# Patient Record
Sex: Female | Born: 1960 | Race: White | Hispanic: No | Marital: Married | Smoking: Never smoker
Health system: Southern US, Community
[De-identification: ages and names within clinical notes are randomized; demographics above are authoritative.]

## PROBLEM LIST (undated history)

## (undated) DIAGNOSIS — M810 Age-related osteoporosis without current pathological fracture: Secondary | ICD-10-CM

## (undated) HISTORY — DX: Age-related osteoporosis without current pathological fracture: M81.0

## (undated) HISTORY — PX: TONSILECTOMY/ADENOIDECTOMY WITH MYRINGOTOMY: SHX6125

---

## 1998-10-11 ENCOUNTER — Ambulatory Visit (HOSPITAL_BASED_OUTPATIENT_CLINIC_OR_DEPARTMENT_OTHER): Admission: RE | Admit: 1998-10-11 | Discharge: 1998-10-11 | Payer: Self-pay | Admitting: Orthopedic Surgery

## 1999-01-21 ENCOUNTER — Encounter (INDEPENDENT_AMBULATORY_CARE_PROVIDER_SITE_OTHER): Payer: Self-pay

## 1999-01-21 ENCOUNTER — Other Ambulatory Visit: Admission: RE | Admit: 1999-01-21 | Discharge: 1999-01-21 | Payer: Self-pay | Admitting: Obstetrics and Gynecology

## 1999-03-14 ENCOUNTER — Encounter (INDEPENDENT_AMBULATORY_CARE_PROVIDER_SITE_OTHER): Payer: Self-pay | Admitting: Specialist

## 1999-03-14 ENCOUNTER — Other Ambulatory Visit: Admission: RE | Admit: 1999-03-14 | Discharge: 1999-03-14 | Payer: Self-pay | Admitting: Obstetrics and Gynecology

## 2000-08-31 ENCOUNTER — Encounter: Payer: Self-pay | Admitting: Family Medicine

## 2000-08-31 ENCOUNTER — Ambulatory Visit (HOSPITAL_COMMUNITY): Admission: RE | Admit: 2000-08-31 | Discharge: 2000-08-31 | Payer: Self-pay | Admitting: Family Medicine

## 2000-09-10 ENCOUNTER — Other Ambulatory Visit: Admission: RE | Admit: 2000-09-10 | Discharge: 2000-09-10 | Payer: Self-pay | Admitting: Obstetrics and Gynecology

## 2001-12-16 ENCOUNTER — Other Ambulatory Visit: Admission: RE | Admit: 2001-12-16 | Discharge: 2001-12-16 | Payer: Self-pay | Admitting: Obstetrics and Gynecology

## 2002-12-31 ENCOUNTER — Other Ambulatory Visit: Admission: RE | Admit: 2002-12-31 | Discharge: 2002-12-31 | Payer: Self-pay | Admitting: Obstetrics and Gynecology

## 2004-02-09 ENCOUNTER — Other Ambulatory Visit: Admission: RE | Admit: 2004-02-09 | Discharge: 2004-02-09 | Payer: Self-pay | Admitting: Obstetrics and Gynecology

## 2005-06-05 ENCOUNTER — Other Ambulatory Visit: Admission: RE | Admit: 2005-06-05 | Discharge: 2005-06-05 | Payer: Self-pay | Admitting: Obstetrics and Gynecology

## 2006-08-10 ENCOUNTER — Other Ambulatory Visit: Admission: RE | Admit: 2006-08-10 | Discharge: 2006-08-10 | Payer: Self-pay | Admitting: Obstetrics and Gynecology

## 2009-01-26 ENCOUNTER — Other Ambulatory Visit: Admission: RE | Admit: 2009-01-26 | Discharge: 2009-01-26 | Payer: Self-pay | Admitting: Obstetrics and Gynecology

## 2009-02-25 ENCOUNTER — Encounter: Admission: RE | Admit: 2009-02-25 | Discharge: 2009-02-25 | Payer: Self-pay | Admitting: Family Medicine

## 2010-01-02 IMAGING — CR DG LUMBAR SPINE COMPLETE 4+V
5 series · 5 of 5 positions shown · non-contrast
Comparison: None

CLINICAL DATA: Osteoporosis, evaluate for fracture

LUMBAR SPINE - COMPLETE 4+ VIEW

[view not recorded (1 of 5)]
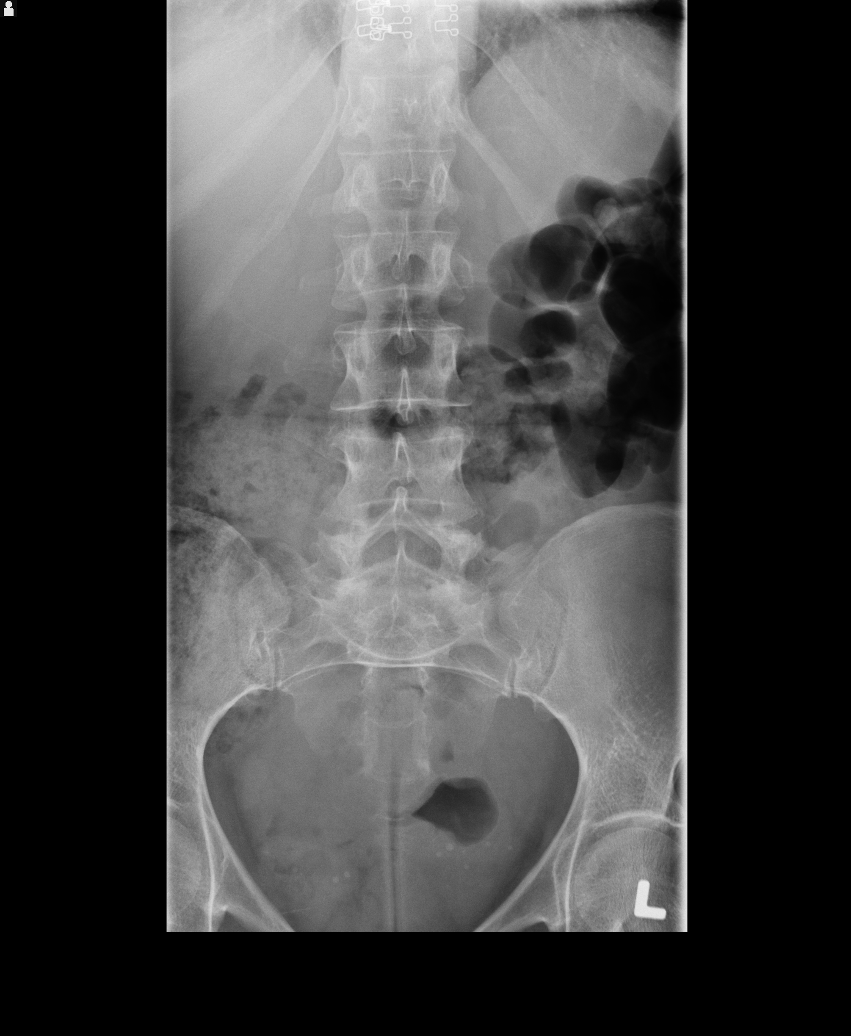

[view not recorded (2 of 5)]
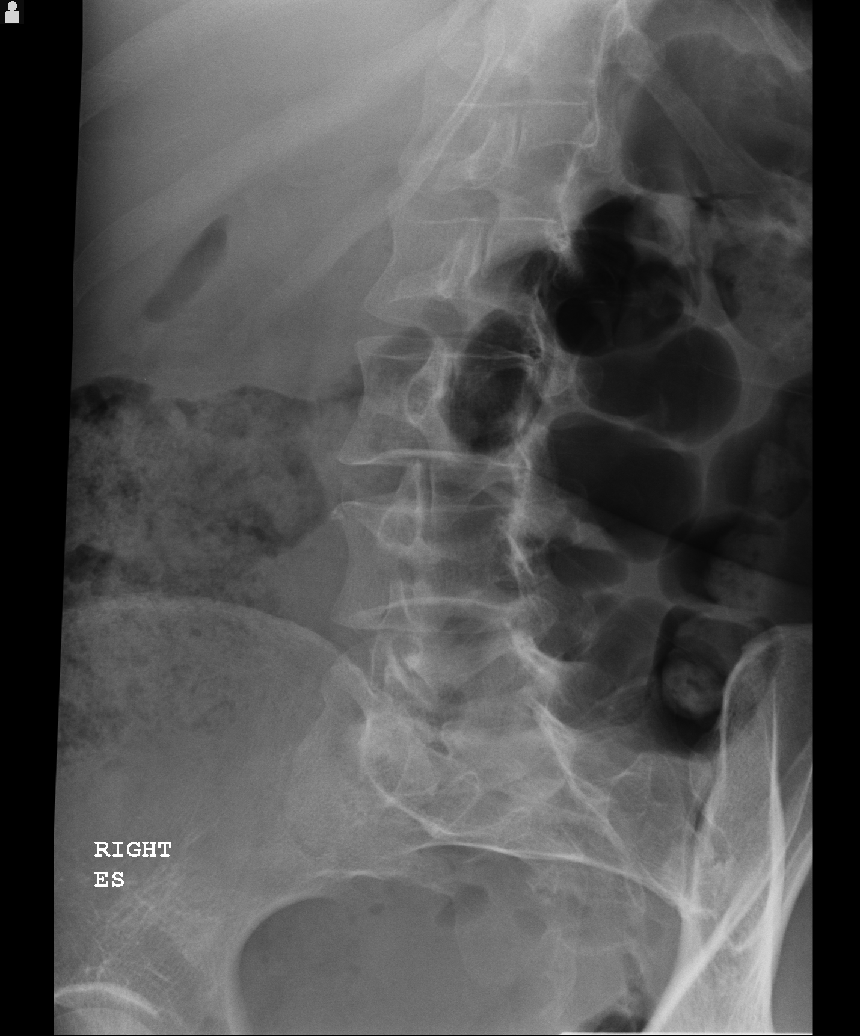

[view not recorded (3 of 5)]
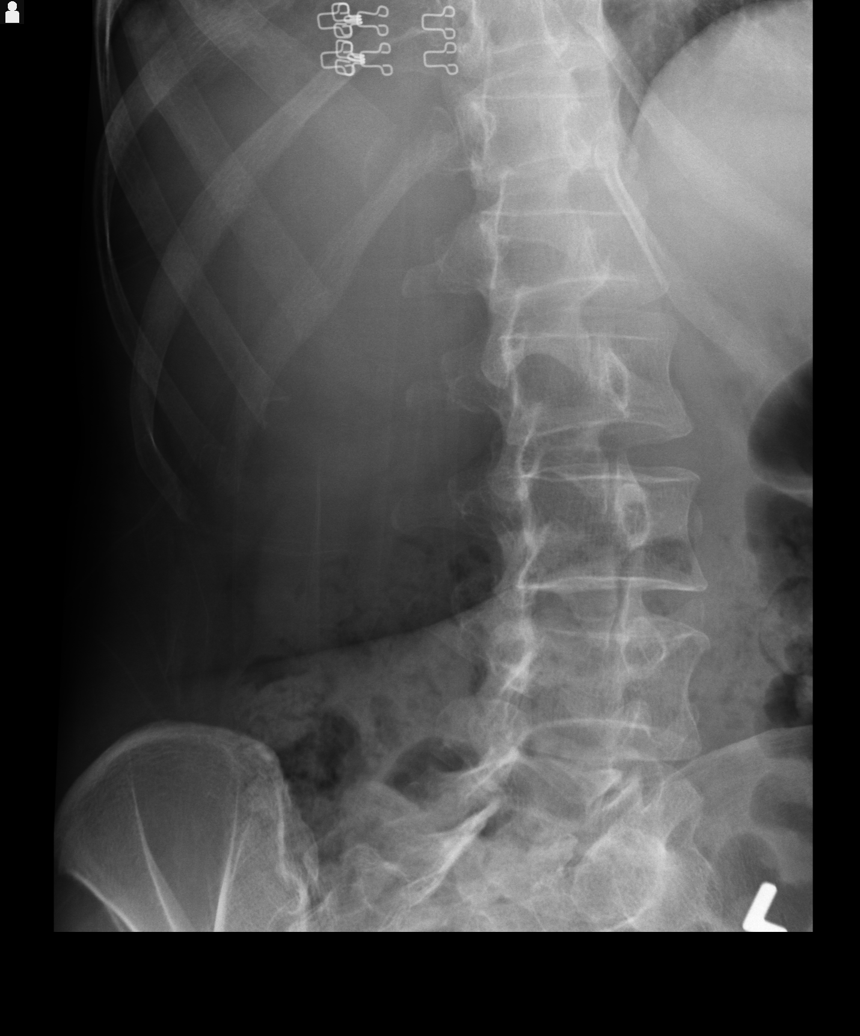

[view not recorded (4 of 5)]
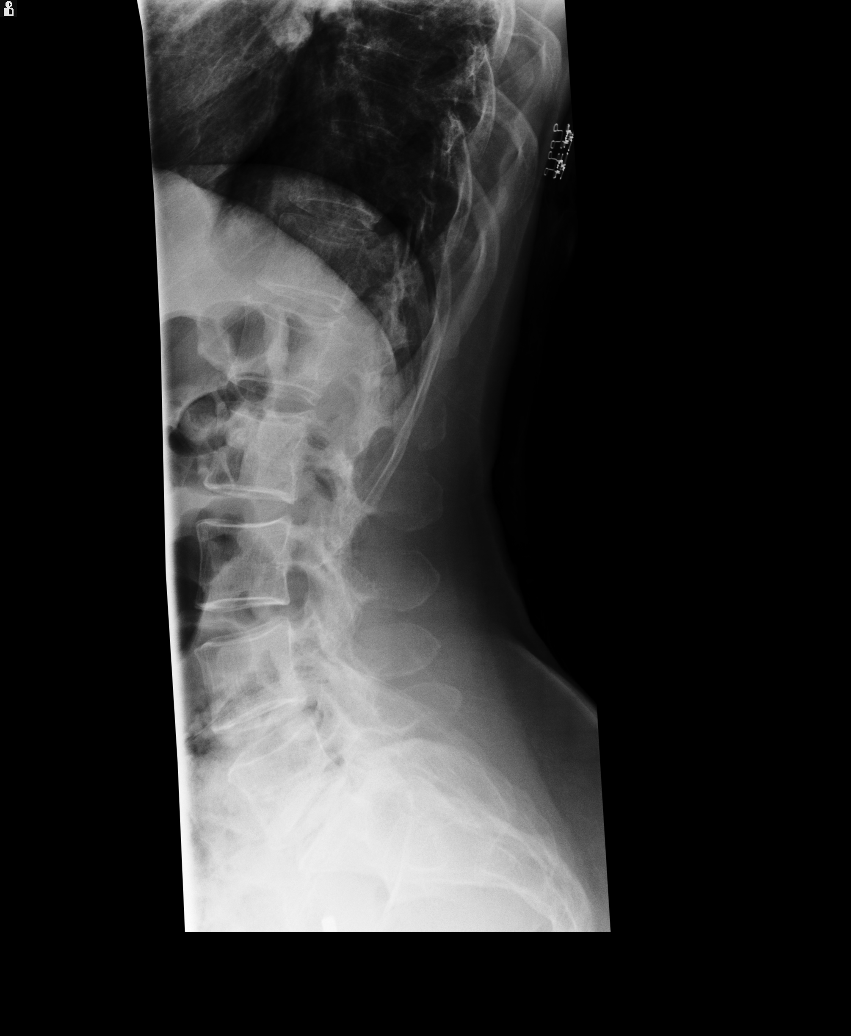

[view not recorded (5 of 5)]
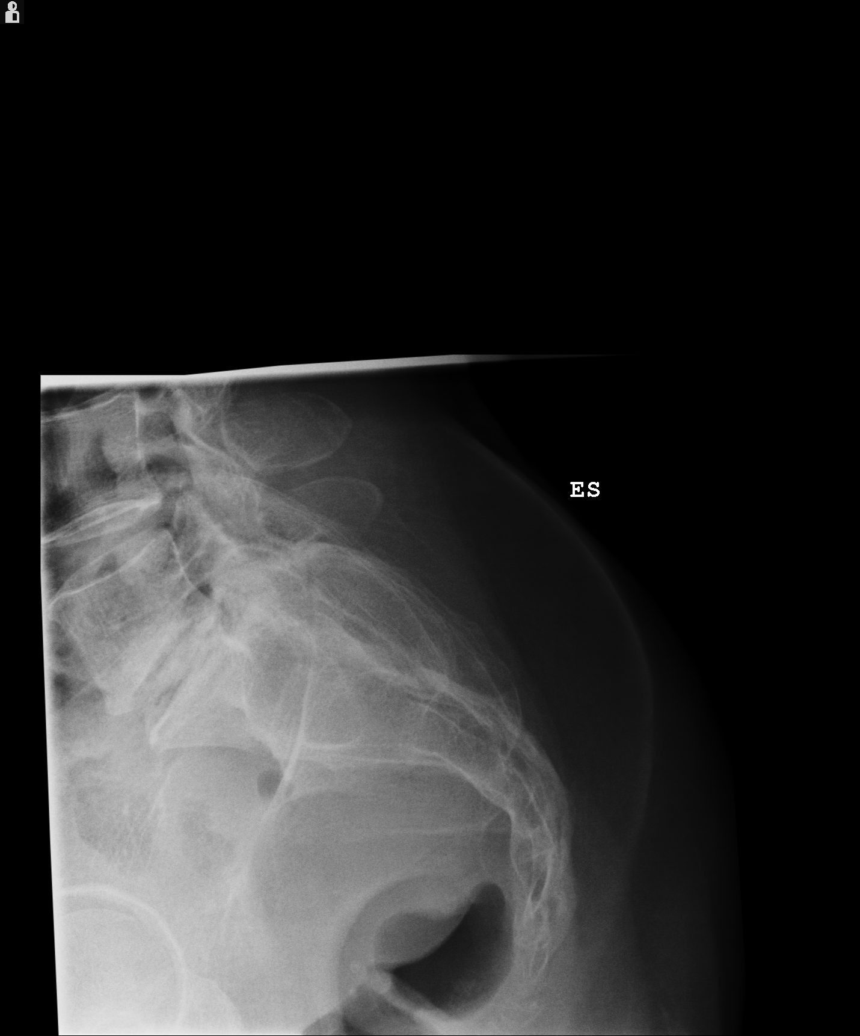

[5 of 5 positions shown; findings below may reference images not displayed]

FINDINGS: The lumbar vertebrae are in normal alignment.  There is
degenerative disc disease at the L5 S1 level with slight loss of
disc space.  No acute compression deformity is seen.  The SI joints
appear normal.
IMPRESSION: Normal alignment.  Degenerative disc disease at L5-S1.

## 2016-09-12 DIAGNOSIS — Z01419 Encounter for gynecological examination (general) (routine) without abnormal findings: Secondary | ICD-10-CM | POA: Diagnosis not present

## 2016-09-12 DIAGNOSIS — Z6822 Body mass index (BMI) 22.0-22.9, adult: Secondary | ICD-10-CM | POA: Diagnosis not present

## 2017-02-19 DIAGNOSIS — Z1231 Encounter for screening mammogram for malignant neoplasm of breast: Secondary | ICD-10-CM | POA: Diagnosis not present

## 2017-02-21 DIAGNOSIS — Z0000 Encounter for general adult medical examination without abnormal findings: Secondary | ICD-10-CM | POA: Diagnosis not present

## 2017-02-21 DIAGNOSIS — Z136 Encounter for screening for cardiovascular disorders: Secondary | ICD-10-CM | POA: Diagnosis not present

## 2017-03-06 DIAGNOSIS — N943 Premenstrual tension syndrome: Secondary | ICD-10-CM | POA: Diagnosis not present

## 2017-03-06 DIAGNOSIS — M810 Age-related osteoporosis without current pathological fracture: Secondary | ICD-10-CM | POA: Diagnosis not present

## 2017-03-06 DIAGNOSIS — Z23 Encounter for immunization: Secondary | ICD-10-CM | POA: Diagnosis not present

## 2017-03-06 DIAGNOSIS — Z0000 Encounter for general adult medical examination without abnormal findings: Secondary | ICD-10-CM | POA: Diagnosis not present

## 2017-07-10 DIAGNOSIS — L821 Other seborrheic keratosis: Secondary | ICD-10-CM | POA: Diagnosis not present

## 2017-07-10 DIAGNOSIS — L0100 Impetigo, unspecified: Secondary | ICD-10-CM | POA: Diagnosis not present

## 2017-07-10 DIAGNOSIS — D485 Neoplasm of uncertain behavior of skin: Secondary | ICD-10-CM | POA: Diagnosis not present

## 2017-07-10 DIAGNOSIS — D225 Melanocytic nevi of trunk: Secondary | ICD-10-CM | POA: Diagnosis not present

## 2017-07-10 DIAGNOSIS — D2271 Melanocytic nevi of right lower limb, including hip: Secondary | ICD-10-CM | POA: Diagnosis not present

## 2017-07-27 DIAGNOSIS — R6889 Other general symptoms and signs: Secondary | ICD-10-CM | POA: Diagnosis not present

## 2018-01-21 DIAGNOSIS — M25561 Pain in right knee: Secondary | ICD-10-CM | POA: Diagnosis not present

## 2018-01-23 DIAGNOSIS — Z01419 Encounter for gynecological examination (general) (routine) without abnormal findings: Secondary | ICD-10-CM | POA: Diagnosis not present

## 2018-01-23 DIAGNOSIS — Z6821 Body mass index (BMI) 21.0-21.9, adult: Secondary | ICD-10-CM | POA: Diagnosis not present

## 2018-01-24 ENCOUNTER — Encounter: Payer: Self-pay | Admitting: Obstetrics and Gynecology

## 2018-01-24 DIAGNOSIS — M810 Age-related osteoporosis without current pathological fracture: Secondary | ICD-10-CM | POA: Diagnosis not present

## 2018-01-24 DIAGNOSIS — M85852 Other specified disorders of bone density and structure, left thigh: Secondary | ICD-10-CM | POA: Diagnosis not present

## 2018-02-12 DIAGNOSIS — K080 Exfoliation of teeth due to systemic causes: Secondary | ICD-10-CM | POA: Diagnosis not present

## 2018-02-20 DIAGNOSIS — Z1231 Encounter for screening mammogram for malignant neoplasm of breast: Secondary | ICD-10-CM | POA: Diagnosis not present

## 2018-04-17 DIAGNOSIS — Z0000 Encounter for general adult medical examination without abnormal findings: Secondary | ICD-10-CM | POA: Diagnosis not present

## 2018-04-17 DIAGNOSIS — Z13220 Encounter for screening for lipoid disorders: Secondary | ICD-10-CM | POA: Diagnosis not present

## 2018-04-23 DIAGNOSIS — Z0000 Encounter for general adult medical examination without abnormal findings: Secondary | ICD-10-CM | POA: Diagnosis not present

## 2018-04-29 DIAGNOSIS — Z23 Encounter for immunization: Secondary | ICD-10-CM | POA: Diagnosis not present

## 2018-05-29 DIAGNOSIS — H5213 Myopia, bilateral: Secondary | ICD-10-CM | POA: Diagnosis not present

## 2018-07-24 DIAGNOSIS — R509 Fever, unspecified: Secondary | ICD-10-CM | POA: Diagnosis not present

## 2019-02-28 ENCOUNTER — Encounter: Payer: Self-pay | Admitting: Obstetrics and Gynecology

## 2019-02-28 DIAGNOSIS — Z803 Family history of malignant neoplasm of breast: Secondary | ICD-10-CM | POA: Diagnosis not present

## 2019-02-28 DIAGNOSIS — Z1231 Encounter for screening mammogram for malignant neoplasm of breast: Secondary | ICD-10-CM | POA: Diagnosis not present

## 2019-04-09 DIAGNOSIS — Z23 Encounter for immunization: Secondary | ICD-10-CM | POA: Diagnosis not present

## 2019-04-16 DIAGNOSIS — E7800 Pure hypercholesterolemia, unspecified: Secondary | ICD-10-CM | POA: Diagnosis not present

## 2019-04-16 DIAGNOSIS — Z0000 Encounter for general adult medical examination without abnormal findings: Secondary | ICD-10-CM | POA: Diagnosis not present

## 2019-04-28 DIAGNOSIS — Z0000 Encounter for general adult medical examination without abnormal findings: Secondary | ICD-10-CM | POA: Diagnosis not present

## 2019-06-16 DIAGNOSIS — Z85828 Personal history of other malignant neoplasm of skin: Secondary | ICD-10-CM | POA: Diagnosis not present

## 2019-06-16 DIAGNOSIS — Z86018 Personal history of other benign neoplasm: Secondary | ICD-10-CM | POA: Diagnosis not present

## 2019-06-16 DIAGNOSIS — D2271 Melanocytic nevi of right lower limb, including hip: Secondary | ICD-10-CM | POA: Diagnosis not present

## 2019-06-16 DIAGNOSIS — D225 Melanocytic nevi of trunk: Secondary | ICD-10-CM | POA: Diagnosis not present

## 2019-06-25 ENCOUNTER — Other Ambulatory Visit: Payer: Self-pay

## 2019-06-25 ENCOUNTER — Other Ambulatory Visit (HOSPITAL_COMMUNITY)
Admission: RE | Admit: 2019-06-25 | Discharge: 2019-06-25 | Disposition: A | Discharge status: Home / Self Care | Payer: Federal, State, Local not specified - PPO | Source: Ambulatory Visit | Attending: Obstetrics and Gynecology | Admitting: Obstetrics and Gynecology

## 2019-06-25 ENCOUNTER — Encounter: Payer: Self-pay | Admitting: Obstetrics and Gynecology

## 2019-06-25 ENCOUNTER — Other Ambulatory Visit: Payer: Self-pay | Admitting: *Deleted

## 2019-06-25 ENCOUNTER — Ambulatory Visit: Payer: Federal, State, Local not specified - PPO | Admitting: Obstetrics and Gynecology

## 2019-06-25 VITALS — BP 100/62 | HR 84 | Temp 98.0°F | Ht 63.5 in | Wt 129.0 lb

## 2019-06-25 DIAGNOSIS — Z124 Encounter for screening for malignant neoplasm of cervix: Secondary | ICD-10-CM | POA: Diagnosis not present

## 2019-06-25 DIAGNOSIS — N952 Postmenopausal atrophic vaginitis: Secondary | ICD-10-CM | POA: Diagnosis not present

## 2019-06-25 DIAGNOSIS — Z8739 Personal history of other diseases of the musculoskeletal system and connective tissue: Secondary | ICD-10-CM

## 2019-06-25 DIAGNOSIS — Z01419 Encounter for gynecological examination (general) (routine) without abnormal findings: Secondary | ICD-10-CM

## 2019-06-25 MED ORDER — NONFORMULARY OR COMPOUNDED ITEM: 3 refills | Status: DC

## 2019-06-25 NOTE — Patient Instructions (Addendum)
EXERCISE AND DIET:  We recommended that you start or continue a regular exercise program for good health. Regular exercise means any activity that makes your heart beat faster and makes you sweat.  We recommend exercising at least 30 minutes per day at least 3 days a week, preferably 4 or 5.  We also recommend a diet low in fat and sugar.  Inactivity, poor dietary choices and obesity can cause diabetes, heart attack, stroke, and kidney damage, among others.   ° °ALCOHOL AND SMOKING:  Women should limit their alcohol intake to no more than 7 drinks/beers/glasses of wine (combined, not each!) per week. Moderation of alcohol intake to this level decreases your risk of breast cancer and liver damage. And of course, no recreational drugs are part of a healthy lifestyle.  And absolutely no smoking or even second hand smoke. Most people know smoking can cause heart and lung diseases, but did you know it also contributes to weakening of your bones? Aging of your skin?  Yellowing of your teeth and nails? ° °CALCIUM AND VITAMIN D:  Adequate intake of calcium and Vitamin D are recommended.  The recommendations for exact amounts of these supplements seem to change often, but generally speaking 1,200 mg of calcium (between diet and supplement) and 800 units of Vitamin D per day seems prudent. Certain women may benefit from higher intake of Vitamin D.  If you are among these women, your doctor will have told you during your visit.   ° °PAP SMEARS:  Pap smears, to check for cervical cancer or precancers,  have traditionally been done yearly, although recent scientific advances have shown that most women can have pap smears less often.  However, every woman still should have a physical exam from her gynecologist every year. It will include a breast check, inspection of the vulva and vagina to check for abnormal growths or skin changes, a visual exam of the cervix, and then an exam to evaluate the size and shape of the uterus and  ovaries.  And after 58 years of age, a rectal exam is indicated to check for rectal cancers. We will also provide age appropriate advice regarding health maintenance, like when you should have certain vaccines, screening for sexually transmitted diseases, bone density testing, colonoscopy, mammograms, etc.  ° °MAMMOGRAMS:  All women over 40 years old should have a yearly mammogram. Many facilities now offer a "3D" mammogram, which may cost around $50 extra out of pocket. If possible,  we recommend you accept the option to have the 3D mammogram performed.  It both reduces the number of women who will be called back for extra views which then turn out to be normal, and it is better than the routine mammogram at detecting truly abnormal areas.   ° °COLON CANCER SCREENING: Now recommend starting at age 45. At this time colonoscopy is not covered for routine screening until 50. There are take home tests that can be done between 45-49.  ° °COLONOSCOPY:  Colonoscopy to screen for colon cancer is recommended for all women at age 50.  We know, you hate the idea of the prep.  We agree, BUT, having colon cancer and not knowing it is worse!!  Colon cancer so often starts as a polyp that can be seen and removed at colonscopy, which can quite literally save your life!  And if your first colonoscopy is normal and you have no family history of colon cancer, most women don't have to have it again for   10 years.  Once every ten years, you can do something that may end up saving your life, right?  We will be happy to help you get it scheduled when you are ready.  Be sure to check your insurance coverage so you understand how much it will cost.  It may be covered as a preventative service at no cost, but you should check your particular policy.   ° ° ° °Breast Self-Awareness °Breast self-awareness means being familiar with how your breasts look and feel. It involves checking your breasts regularly and reporting any changes to your  health care provider. °Practicing breast self-awareness is important. A change in your breasts can be a sign of a serious medical problem. Being familiar with how your breasts look and feel allows you to find any problems early, when treatment is more likely to be successful. All women should practice breast self-awareness, including women who have had breast implants. °How to do a breast self-exam °One way to learn what is normal for your breasts and whether your breasts are changing is to do a breast self-exam. To do a breast self-exam: °Look for Changes ° °1. Remove all the clothing above your waist. °2. Stand in front of a mirror in a room with good lighting. °3. Put your hands on your hips. °4. Push your hands firmly downward. °5. Compare your breasts in the mirror. Look for differences between them (asymmetry), such as: °? Differences in shape. °? Differences in size. °? Puckers, dips, and bumps in one breast and not the other. °6. Look at each breast for changes in your skin, such as: °? Redness. °? Scaly areas. °7. Look for changes in your nipples, such as: °? Discharge. °? Bleeding. °? Dimpling. °? Redness. °? A change in position. °Feel for Changes °Carefully feel your breasts for lumps and changes. It is best to do this while lying on your back on the floor and again while sitting or standing in the shower or tub with soapy water on your skin. Feel each breast in the following way: °· Place the arm on the side of the breast you are examining above your head. °· Feel your breast with the other hand. °· Start in the nipple area and make ¾ inch (2 cm) overlapping circles to feel your breast. Use the pads of your three middle fingers to do this. Apply light pressure, then medium pressure, then firm pressure. The light pressure will allow you to feel the tissue closest to the skin. The medium pressure will allow you to feel the tissue that is a little deeper. The firm pressure will allow you to feel the tissue  close to the ribs. °· Continue the overlapping circles, moving downward over the breast until you feel your ribs below your breast. °· Move one finger-width toward the center of the body. Continue to use the ¾ inch (2 cm) overlapping circles to feel your breast as you move slowly up toward your collarbone. °· Continue the up and down exam using all three pressures until you reach your armpit. ° °Write Down What You Find ° °Write down what is normal for each breast and any changes that you find. Keep a written record with breast changes or normal findings for each breast. By writing this information down, you do not need to depend only on memory for size, tenderness, or location. Write down where you are in your menstrual cycle, if you are still menstruating. °If you are having trouble noticing differences   in your breasts, do not get discouraged. With time you will become more familiar with the variations in your breasts and more comfortable with the exam. °How often should I examine my breasts? °Examine your breasts every month. If you are breastfeeding, the best time to examine your breasts is after a feeding or after using a breast pump. If you menstruate, the best time to examine your breasts is 5-7 days after your period is over. During your period, your breasts are lumpier, and it may be more difficult to notice changes. °When should I see my health care provider? °See your health care provider if you notice: °· A change in shape or size of your breasts or nipples. °· A change in the skin of your breast or nipples, such as a reddened or scaly area. °· Unusual discharge from your nipples. °· A lump or thick area that was not there before. °· Pain in your breasts. °· Anything that concerns you. ° ° °Osteoporosis ° °Osteoporosis is thinning and loss of density in your bones. Osteoporosis makes bones more brittle and fragile and more likely to break (fracture). Over time, osteoporosis can cause your bones to become  so weak that they fracture after a minor fall. Bones in the hip, wrist, and spine are most likely to fracture due to osteoporosis. °What are the causes? °The exact cause of this condition is not known. °What increases the risk? °You may be at greater risk for osteoporosis if you: °· Have a family history of the condition. °· Have poor nutrition. °· Use steroid medicines, such as prednisone. °· Are female. °· Are age 50 or older. °· Smoke or have a history of smoking. °· Are not physically active (are sedentary). °· Are white (Caucasian) or of Asian descent. °· Have a small body frame. °· Take certain medicines, such as antiseizure medicines. °What are the signs or symptoms? °A fracture might be the first sign of osteoporosis, especially if the fracture results from a fall or injury that usually would not cause a bone to break. Other signs and symptoms include: °· Pain in the neck or low back. °· Stooped posture. °· Loss of height. °How is this diagnosed? °This condition may be diagnosed based on: °· Your medical history. °· A physical exam. °· A bone mineral density test, also called a DXA or DEXA test (dual-energy X-ray absorptiometry test). This test uses X-rays to measure the amount of minerals in your bones. °How is this treated? °The goal of treatment is to strengthen your bones and lower your risk for a fracture. Treatment may involve: °· Making lifestyle changes, such as: °? Including foods with more calcium and vitamin D in your diet. °? Doing weight-bearing and muscle-strengthening exercises. °? Stopping tobacco use. °? Limiting alcohol intake. °· Taking medicine to slow the process of bone loss or to increase bone density. °· Taking daily supplements of calcium and vitamin D. °· Taking hormone replacement medicines, such as estrogen for women and testosterone for men. °· Monitoring your levels of calcium and vitamin D. °Follow these instructions at home: ° °Activity °· Exercise as told by your health care  provider. Ask your health care provider what exercises and activities are safe for you. You should do: °? Exercises that make you work against gravity (weight-bearing exercises), such as tai chi, yoga, or walking. °? Exercises to strengthen muscles, such as lifting weights. °Lifestyle °· Limit alcohol intake to no more than 1 drink a day for nonpregnant women and   2 drinks a day for men. One drink equals 12 oz of beer, 5 oz of wine, or 1½ oz of hard liquor. °· Do not use any products that contain nicotine or tobacco, such as cigarettes and e-cigarettes. If you need help quitting, ask your health care provider. °Preventing falls °· Use devices to help you move around (mobility aids) as needed, such as canes, walkers, scooters, or crutches. °· Keep rooms well-lit and clutter-free. °· Remove tripping hazards from walkways, including cords and throw rugs. °· Install grab bars in bathrooms and safety rails on stairs. °· Use rubber mats in the bathroom and other areas that are often wet or slippery. °· Wear closed-toe shoes that fit well and support your feet. Wear shoes that have rubber soles or low heels. °· Review your medicines with your health care provider. Some medicines can cause dizziness or changes in blood pressure, which can increase your risk of falling. °General instructions °· Include calcium and vitamin D in your diet. Calcium is important for bone health, and vitamin D helps your body to absorb calcium. Good sources of calcium and vitamin D include: °? Certain fatty fish, such as salmon and tuna. °? Products that have calcium and vitamin D added to them (fortified products), such as fortified cereals. °? Egg yolks. °? Cheese. °? Liver. °· Take over-the-counter and prescription medicines only as told by your health care provider. °· Keep all follow-up visits as told by your health care provider. This is important. °Contact a health care provider if: °· You have never been screened for osteoporosis and you  are: °? A woman who is age 65 or older. °? A man who is age 70 or older. °Get help right away if: °· You fall or injure yourself. °Summary °· Osteoporosis is thinning and loss of density in your bones. This makes bones more brittle and fragile and more likely to break (fracture),even with minor falls. °· The goal of treatment is to strengthen your bones and reduce your risk for a fracture. °· Include calcium and vitamin D in your diet. Calcium is important for bone health, and vitamin D helps your body to absorb calcium. °· Talk with your health care provider about screening for osteoporosis if you are a woman who is age 65 or older, or a man who is age 70 or older. °This information is not intended to replace advice given to you by your health care provider. Make sure you discuss any questions you have with your health care provider. °Document Released: 03/22/2005 Document Revised: 05/25/2017 Document Reviewed: 04/06/2017 °Elsevier Patient Education © 2020 [employer] ° °

## 2019-06-25 NOTE — Addendum Note (Signed)
Addended by: Dorothy Spark on: 06/25/2019 03:25 PM   Modules accepted: Orders

## 2019-06-25 NOTE — Progress Notes (Signed)
58 y.o. G26P2002 Married White or Caucasian Not Hispanic or Latino female here for annual exam.  She has no vasomotor symptoms. She is asking about bio-identical hormones. Less elasticity of the skin, more weight in her abdomen.  She feels a hair less sharp than normal. Vaginal dryness, helped with estradiol. No vaginal bleeding. No dyspareunia.   She is on Boniva for osteoporosis. Building bone. She has been on the [facility] for about 3 years.   No LMP recorded. Patient is postmenopausal.          Sexually active: Yes.    The current method of family planning is post menopausal status.    Exercising: Yes.    cardio 5 days a week, yoga, weight bearing exercises. Smoker:  no  Health Maintenance: Pap:  2019 normal Dr. Clementeen Hoof  History of abnormal Pap:  no MMG:  04/2019 at [facility]  BMD:   2019 Osteoporotic in lower spine. She states it has been like that in her 76's.  Colonoscopy: 2015 normal due in Jan  TDaP:  Unsure, she will check with her primary  Gardasil:no    reports that she has never smoked. She has never used smokeless tobacco. She reports current alcohol use of about 2.0 standard drinks of alcohol per week. She reports that she does not use drugs. 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 2 sons, 30 and 73. One is back home working during [employer].    Past Medical History:  Diagnosis Date  . Osteoporosis     Past Surgical History:  Procedure Laterality Date  . TONSILECTOMY/ADENOIDECTOMY WITH MYRINGOTOMY Bilateral     Current Outpatient Medications  Medication Sig Dispense Refill  . calcium-vitamin D (OSCAL WITH D) 500-200 MG-UNIT tablet Take 1 tablet by mouth.    . estradiol (ESTRACE) 0.1 MG/GM vaginal cream Place 1 Applicatorful vaginally 3 (three) times a week.    . ibandronate (BONIVA) 150 MG tablet Take 150 mg by mouth every 30 (thirty) days. Take in the morning with a full glass of water, on an empty stomach, and do not take anything  else by mouth or lie down for the next 30 min.    . sertraline (ZOLOFT) 25 MG tablet Take 25 mg by mouth daily.     No current facility-administered medications for this visit.    Family History  Problem Relation Age of Onset  . Leukemia Mother        CLL turned into lymphoma.   . Lung cancer Father        died at 36  . Breast cancer Paternal Grandmother   . Breast cancer Other 30       paternal 1/2 Aunt    Review of Systems  All other systems reviewed and are negative.   Exam:   BP 100/62   Pulse 84   Temp 98 F (36.7 C)   Ht 5' 3.5" (1.613 m)   Wt 129 lb (58.5 kg)   SpO2 97%   BMI 22.49 kg/m   Weight change: @WEIGHTCHANGE @ Height:   Height: 5' 3.5" (161.3 cm)  Ht Readings from Last 3 Encounters:  06/25/19 5' 3.5" (1.613 m)    General appearance: alert, cooperative and appears stated age Head: Normocephalic, without obvious abnormality, atraumatic Neck: no adenopathy, supple, symmetrical, trachea midline and thyroid normal to inspection and palpation Lungs: clear to auscultation bilaterally Cardiovascular: regular rate and rhythm Breasts: normal appearance, no masses or tenderness Abdomen: soft, non-tender; non  distended,  no masses,  no organomegaly Extremities: extremities normal, atraumatic, no cyanosis or edema Skin: Skin color, texture, turgor normal. No rashes or lesions Lymph nodes: Cervical, supraclavicular, and axillary nodes normal. No abnormal inguinal nodes palpated Neurologic: Grossly normal   Pelvic: External genitalia:  no lesions              Urethra:  normal appearing urethra with no masses, tenderness or lesions              Bartholins and Skenes: normal                 Vagina: mildly atrophic appearing vagina with normal color and discharge, no lesions              Cervix: no lesions and friable with pap, no ectropion               Bimanual Exam:  Uterus:  normal size, contour, position, consistency, mobility, non-tender              Adnexa:  no mass, fullness, tenderness               Rectovaginal: Confirms               Anus:  normal sphincter tone, no lesions  Gae Dry chaperoned for the exam.  A:  Well Woman with normal exam  H/O osteoporosis, on Boniva  Vaginal atrophy well treated with compounded vaginal estrogen  P:   Pap with hpv  Labs with primary  Mammogram and DEXA just done  Discussed breast self exam  Discussed calcium and vit D intake  She uses compounded estrace cream, will call in the equivalent of estrace cream, 1 gram 2 x week at hs.

## 2019-07-01 LAB — CYTOLOGY - PAP
Comment: NEGATIVE
Diagnosis: NEGATIVE
High risk HPV: NEGATIVE

## 2019-07-09 DIAGNOSIS — M25562 Pain in left knee: Secondary | ICD-10-CM | POA: Diagnosis not present

## 2019-07-09 DIAGNOSIS — M79671 Pain in right foot: Secondary | ICD-10-CM | POA: Diagnosis not present

## 2019-07-18 DIAGNOSIS — G5761 Lesion of plantar nerve, right lower limb: Secondary | ICD-10-CM | POA: Diagnosis not present

## 2019-09-03 DIAGNOSIS — R432 Parageusia: Secondary | ICD-10-CM | POA: Diagnosis not present

## 2019-09-03 DIAGNOSIS — Z03818 Encounter for observation for suspected exposure to other biological agents ruled out: Secondary | ICD-10-CM | POA: Diagnosis not present

## 2019-09-03 DIAGNOSIS — J029 Acute pharyngitis, unspecified: Secondary | ICD-10-CM | POA: Diagnosis not present

## 2019-09-30 DIAGNOSIS — Z1159 Encounter for screening for other viral diseases: Secondary | ICD-10-CM | POA: Diagnosis not present

## 2019-10-03 DIAGNOSIS — Z86010 Personal history of colonic polyps: Secondary | ICD-10-CM | POA: Diagnosis not present

## 2020-03-03 ENCOUNTER — Encounter: Payer: Self-pay | Admitting: Obstetrics and Gynecology

## 2020-03-03 DIAGNOSIS — Z1231 Encounter for screening mammogram for malignant neoplasm of breast: Secondary | ICD-10-CM | POA: Diagnosis not present

## 2020-03-31 DIAGNOSIS — Z23 Encounter for immunization: Secondary | ICD-10-CM | POA: Diagnosis not present

## 2020-04-27 DIAGNOSIS — Z13220 Encounter for screening for lipoid disorders: Secondary | ICD-10-CM | POA: Diagnosis not present

## 2020-04-27 DIAGNOSIS — Z0000 Encounter for general adult medical examination without abnormal findings: Secondary | ICD-10-CM | POA: Diagnosis not present

## 2020-05-03 DIAGNOSIS — M810 Age-related osteoporosis without current pathological fracture: Secondary | ICD-10-CM | POA: Diagnosis not present

## 2020-05-03 DIAGNOSIS — Z23 Encounter for immunization: Secondary | ICD-10-CM | POA: Diagnosis not present

## 2020-05-03 DIAGNOSIS — Z0000 Encounter for general adult medical examination without abnormal findings: Secondary | ICD-10-CM | POA: Diagnosis not present

## 2020-05-03 DIAGNOSIS — N943 Premenstrual tension syndrome: Secondary | ICD-10-CM | POA: Diagnosis not present

## 2020-06-15 DIAGNOSIS — Z85828 Personal history of other malignant neoplasm of skin: Secondary | ICD-10-CM | POA: Diagnosis not present

## 2020-06-15 DIAGNOSIS — D225 Melanocytic nevi of trunk: Secondary | ICD-10-CM | POA: Diagnosis not present

## 2020-06-15 DIAGNOSIS — D2271 Melanocytic nevi of right lower limb, including hip: Secondary | ICD-10-CM | POA: Diagnosis not present

## 2020-06-15 DIAGNOSIS — Z86018 Personal history of other benign neoplasm: Secondary | ICD-10-CM | POA: Diagnosis not present

## 2020-06-17 DIAGNOSIS — Z03818 Encounter for observation for suspected exposure to other biological agents ruled out: Secondary | ICD-10-CM | POA: Diagnosis not present

## 2020-06-17 DIAGNOSIS — J069 Acute upper respiratory infection, unspecified: Secondary | ICD-10-CM | POA: Diagnosis not present

## 2020-06-29 NOTE — Progress Notes (Signed)
60 y.o. G58P2002 Married White or Caucasian Not Hispanic or Latino female here for annual exam.  No vaginal bleeding. No dyspareunia. Uses vaginal estrogen.   She c/o feeling some vaginal prolapse intermittently, notices it one x a week when she is standing. Not painful. Weird sensation.     No LMP recorded. Patient is postmenopausal.          Sexually active: Yes.    The current method of family planning is post menopausal status.    Exercising: Yes.    5 times a week cardio Smoker:  no  Health Maintenance: Pap:  06-25-2019 negative, HR HPV negative  History of abnormal Pap:  no MMG: Fall 2021 with [facility]  BMD:   2019 Osteoporotic in lower spine. She states it has been like that in her 40's. On Boniva.  Colonoscopy: 2021 with [facility] normal per patient TDaP:  UTD per patient Gardasil: n/a   reports that she has never smoked. She has never used smokeless tobacco. She reports current alcohol use of about 2.0 standard drinks of alcohol per week. She reports that she does not use drugs. 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 2 grown sons  Past Medical History:  Diagnosis Date  . Osteoporosis     Past Surgical History:  Procedure Laterality Date  . TONSILECTOMY/ADENOIDECTOMY WITH MYRINGOTOMY Bilateral     Current Outpatient Medications  Medication Sig Dispense Refill  . calcium-vitamin D (OSCAL WITH D) 500-200 MG-UNIT tablet Take 1 tablet by mouth.    . ibandronate (BONIVA) 150 MG tablet Take 150 mg by mouth every 30 (thirty) days. Take in the morning with a full glass of water, on an empty stomach, and do not take anything else by mouth or lie down for the next 30 min.    . NONFORMULARY OR COMPOUNDED ITEM Compounded vaginal estrogen cream 0.02%. Place 1/2 gram vaginally at hs twice weekly.  Dispense 12 grams. 3 refills. 1 each 3  . sertraline (ZOLOFT) 25 MG tablet Take 25 mg by mouth daily.    . fluorouracil (EFUDEX) 5 % cream Apply 1  application topically 2 (two) times daily. (Patient not taking: Reported on 07/01/2020)     No current facility-administered medications for this visit.    Family History  Problem Relation Age of Onset  . Leukemia Mother        CLL turned into lymphoma.   . Lung cancer Father        died at 81  . Breast cancer Paternal Grandmother   . Breast cancer Other 30       paternal 1/2 Aunt    Review of Systems  Constitutional: Negative.   HENT: Negative.   Eyes: Negative.   Respiratory: Negative.   Cardiovascular: Negative.   Gastrointestinal: Negative.   Endocrine: Negative.   Genitourinary: Negative.   Musculoskeletal: Negative.   Skin: Negative.   Allergic/Immunologic: Negative.   Neurological: Negative.   Hematological: Negative.   Psychiatric/Behavioral: Negative.     Exam:   BP 110/60 (BP Location: Right Arm, Patient Position: Sitting, Cuff Size: Normal)   Pulse 64   Resp 16   Ht 5' 3.5" (1.613 m)   Wt 124 lb (56.2 kg)   BMI 21.62 kg/m   Weight change: @WEIGHTCHANGE @ Height:   Height: 5' 3.5" (161.3 cm)  Ht Readings from Last 3 Encounters:  07/01/20 5' 3.5" (1.613 m)  06/25/19 5' 3.5" (1.613 m)    General appearance: alert,  cooperative and appears stated age Head: Normocephalic, without obvious abnormality, atraumatic Neck: no adenopathy, supple, symmetrical, trachea midline and thyroid normal to inspection and palpation Lungs: clear to auscultation bilaterally Cardiovascular: regular rate and rhythm Breasts: normal appearance, no masses or tenderness Abdomen: soft, non-tender; non distended,  no masses,  no organomegaly Extremities: extremities normal, atraumatic, no cyanosis or edema Skin: Skin color, texture, turgor normal. No rashes or lesions Lymph nodes: Cervical, supraclavicular, and axillary nodes normal. No abnormal inguinal nodes palpated Neurologic: Grossly normal   Pelvic: External genitalia:  no lesions              Urethra:  normal appearing  urethra with no masses, tenderness or lesions              Bartholins and Skenes: normal                 Vagina: mildly atrophic appearing vagina with normal color and discharge, no lesions. Small grade 2 cystocele with valsalva, no other significant prolapse.              Cervix: no lesions               Bimanual Exam:  Uterus:  normal size, contour, position, consistency, mobility, non-tender and retroverted              Adnexa: no mass, fullness, tenderness               Rectovaginal: Confirms               Anus:  normal sphincter tone, no lesions   1. Well woman exam No pap this year Colonoscopy UTD Mammogram UTD Labs with primary Discussed breast self awareness  2. History of osteoporosis She is over due for a DEXA, does this with her primary. She will contact her primary Discussed calcium and vit D intake Continue to exercise On Boniva (with her primary)  3. Vaginal atrophy Continue compounded vaginal estrogen  4. Midline cystocele Minimal symptoms. Discussed prolapse. Information given. Avoid heavy lifting and straining Discussed kegels to strengthen her pelvic floor

## 2020-07-01 ENCOUNTER — Other Ambulatory Visit: Payer: Self-pay

## 2020-07-01 ENCOUNTER — Encounter: Payer: Self-pay | Admitting: Obstetrics and Gynecology

## 2020-07-01 ENCOUNTER — Telehealth: Payer: Self-pay | Admitting: *Deleted

## 2020-07-01 ENCOUNTER — Ambulatory Visit (INDEPENDENT_AMBULATORY_CARE_PROVIDER_SITE_OTHER): Payer: Federal, State, Local not specified - PPO | Admitting: Obstetrics and Gynecology

## 2020-07-01 VITALS — BP 110/60 | HR 64 | Resp 16 | Ht 63.5 in | Wt 124.0 lb

## 2020-07-01 DIAGNOSIS — Z01419 Encounter for gynecological examination (general) (routine) without abnormal findings: Secondary | ICD-10-CM

## 2020-07-01 DIAGNOSIS — N8111 Cystocele, midline: Secondary | ICD-10-CM

## 2020-07-01 DIAGNOSIS — N952 Postmenopausal atrophic vaginitis: Secondary | ICD-10-CM

## 2020-07-01 DIAGNOSIS — Z8739 Personal history of other diseases of the musculoskeletal system and connective tissue: Secondary | ICD-10-CM

## 2020-07-01 MED ORDER — NONFORMULARY OR COMPOUNDED ITEM: 3 refills | Status: DC

## 2020-07-01 NOTE — Telephone Encounter (Signed)
-----   Message from Romualdo Bolk, MD sent at 07/01/2020 10:09 AM EST ----- Please refill her compounded vaginal estrogen x 1 year (see chart). Thanks, Noreene Larsson

## 2020-07-01 NOTE — Telephone Encounter (Signed)
Rx called in 

## 2020-07-01 NOTE — Patient Instructions (Signed)
EXERCISE   We recommended that you start or continue a regular exercise program for good health. Physical activity is anything that gets your body moving, some is better than none. The CDC recommends 150 minutes per week of Moderate-Intensity Aerobic Activity and 2 or more days of Muscle Strengthening Activity.  Benefits of exercise are limitless: helps weight loss/weight maintenance, improves mood and energy, helps with depression and anxiety, improves sleep, tones and strengthens muscles, improves balance, improves bone density, protects from chronic conditions such as heart disease, high blood pressure and diabetes and so much more. To learn more visit: https://www.cdc.gov/physicalactivity/index.html  DIET: Good nutrition starts with a healthy diet of fruits, vegetables, whole grains, and lean protein sources. Drink plenty of water for hydration. Minimize empty calories, sodium, sweets. For more information about dietary recommendations visit: https://health.gov/our-work/nutrition-physical-activity/dietary-guidelines and https://www.myplate.gov/  ALCOHOL:  Women should limit their alcohol intake to no more than 7 drinks/beers/glasses of wine (combined, not each!) per week. Moderation of alcohol intake to this level decreases your risk of breast cancer and liver damage.  If you are concerned that you may have a problem, or your friends have told you they are concerned about your drinking, there are many resources to help. A well-known program that is free, effective, and available to all people all over the nation is Alcoholics Anonymous.  Check out this site to learn more: https://www.aa.org/   CALCIUM AND VITAMIN D:  Adequate intake of calcium and Vitamin D are recommended for bone health.  You should be getting between 1000-1200 mg of calcium and 800 units of Vitamin D daily between diet and supplements  PAP SMEARS:  Pap smears, to check for cervical cancer or precancers,  have traditionally been  done yearly, scientific advances have shown that most women can have pap smears less often.  However, every woman still should have a physical exam from her gynecologist every year. It will include a breast check, inspection of the vulva and vagina to check for abnormal growths or skin changes, a visual exam of the cervix, and then an exam to evaluate the size and shape of the uterus and ovaries. We will also provide age appropriate advice regarding health maintenance, like when you should have certain vaccines, screening for sexually transmitted diseases, bone density testing, colonoscopy, mammograms, etc.   MAMMOGRAMS:  All women over 40 years old should have a routine mammogram.   COLON CANCER SCREENING: Now recommend starting at age 45. At this time colonoscopy is not covered for routine screening until 50. There are take home tests that can be done between 45-49.   COLONOSCOPY:  Colonoscopy to screen for colon cancer is recommended for all women at age 50.  We know, you hate the idea of the prep.  We agree, BUT, having colon cancer and not knowing it is worse!!  Colon cancer so often starts as a polyp that can be seen and removed at colonscopy, which can quite literally save your life!  And if your first colonoscopy is normal and you have no family history of colon cancer, most women don't have to have it again for 10 years.  Once every ten years, you can do something that may end up saving your life, right?  We will be happy to help you get it scheduled when you are ready.  Be sure to check your insurance coverage so you understand how much it will cost.  It may be covered as a preventative service at no cost, but you should check   your particular policy.      Breast Self-Awareness Breast self-awareness means being familiar with how your breasts look and feel. It involves checking your breasts regularly and reporting any changes to your health care provider. Practicing breast self-awareness is  important. A change in your breasts can be a sign of a serious medical problem. Being familiar with how your breasts look and feel allows you to find any problems early, when treatment is more likely to be successful. All women should practice breast self-awareness, including women who have had breast implants. How to do a breast self-exam One way to learn what is normal for your breasts and whether your breasts are changing is to do a breast self-exam. To do a breast self-exam: Look for Changes  1. Remove all the clothing above your waist. 2. Stand in front of a mirror in a room with good lighting. 3. Put your hands on your hips. 4. Push your hands firmly downward. 5. Compare your breasts in the mirror. Look for differences between them (asymmetry), such as: ? Differences in shape. ? Differences in size. ? Puckers, dips, and bumps in one breast and not the other. 6. Look at each breast for changes in your skin, such as: ? Redness. ? Scaly areas. 7. Look for changes in your nipples, such as: ? Discharge. ? Bleeding. ? Dimpling. ? Redness. ? A change in position. Feel for Changes Carefully feel your breasts for lumps and changes. It is best to do this while lying on your back on the floor and again while sitting or standing in the shower or tub with soapy water on your skin. Feel each breast in the following way:  Place the arm on the side of the breast you are examining above your head.  Feel your breast with the other hand.  Start in the nipple area and make  inch (2 cm) overlapping circles to feel your breast. Use the pads of your three middle fingers to do this. Apply light pressure, then medium pressure, then firm pressure. The light pressure will allow you to feel the tissue closest to the skin. The medium pressure will allow you to feel the tissue that is a little deeper. The firm pressure will allow you to feel the tissue close to the ribs.  Continue the overlapping circles,  moving downward over the breast until you feel your ribs below your breast.  Move one finger-width toward the center of the body. Continue to use the  inch (2 cm) overlapping circles to feel your breast as you move slowly up toward your collarbone.  Continue the up and down exam using all three pressures until you reach your armpit.  Write Down What You Find  Write down what is normal for each breast and any changes that you find. Keep a written record with breast changes or normal findings for each breast. By writing this information down, you do not need to depend only on memory for size, tenderness, or location. Write down where you are in your menstrual cycle, if you are still menstruating. If you are having trouble noticing differences in your breasts, do not get discouraged. With time you will become more familiar with the variations in your breasts and more comfortable with the exam. How often should I examine my breasts? Examine your breasts every month. If you are breastfeeding, the best time to examine your breasts is after a feeding or after using a breast pump. If you menstruate, the best time to   examine your breasts is 5-7 days after your period is over. During your period, your breasts are lumpier, and it may be more difficult to notice changes. When should I see my health care provider? See your health care provider if you notice:  A change in shape or size of your breasts or nipples.  A change in the skin of your breast or nipples, such as a reddened or scaly area.  Unusual discharge from your nipples.  A lump or thick area that was not there before.  Pain in your breasts.  Anything that concerns you. k Kegel Exercises  Kegel exercises can help strengthen your pelvic floor muscles. The pelvic floor is a group of muscles that support your rectum, small intestine, and bladder. In females, pelvic floor muscles also help support the womb (uterus). These muscles help you  control the flow of urine and stool. Kegel exercises are painless and simple, and they do not require any equipment. Your provider may suggest Kegel exercises to:  Improve bladder and bowel control.  Improve sexual response.  Improve weak pelvic floor muscles after surgery to remove the uterus (hysterectomy) or pregnancy (females).  Improve weak pelvic floor muscles after prostate gland removal or surgery (males). Kegel exercises involve squeezing your pelvic floor muscles, which are the same muscles you squeeze when you try to stop the flow of urine or keep from passing gas. The exercises can be done while sitting, standing, or lying down, but it is best to vary your position. Exercises How to do Kegel exercises: 1. Squeeze your pelvic floor muscles tight. You should feel a tight lift in your rectal area. If you are a female, you should also feel a tightness in your vaginal area. Keep your stomach, buttocks, and legs relaxed. 2. Hold the muscles tight for up to 10 seconds. 3. Breathe normally. 4. Relax your muscles. 5. Repeat as told by your health care provider. Repeat this exercise daily as told by your health care provider. Continue to do this exercise for at least 4-6 weeks, or for as long as told by your health care provider. You may be referred to a physical therapist who can help you learn more about how to do Kegel exercises. Depending on your condition, your health care provider may recommend:  Varying how long you squeeze your muscles.  Doing several sets of exercises every day.  Doing exercises for several weeks.  Making Kegel exercises a part of your regular exercise routine. This information is not intended to replace advice given to you by your health care provider. Make sure you discuss any questions you have with your health care provider. Document Revised: 01/30/2018 Document Reviewed: 01/30/2018 Elsevier Patient Education  2020 [employer] About  Cystocele  Overview  The pelvic organs, including the bladder, are normally supported by pelvic floor muscles and ligaments.  When these muscles and ligaments are stretched, weakened or torn, the wall between the bladder and the vagina sags or herniates causing a prolapse, sometimes called a cystocele.  This condition may cause discomfort and problems with emptying the bladder.  It can be present in various stages.  Some people are not aware of the changes.  Others may notice changes at the vaginal opening or a feeling of the bladder dropping outside the body.  Causes of a Cystocele  A cystocele is usually caused by muscle straining or stretching during childbirth.  In addition, cystocele is more common after menopause, because the hormone estrogen helps keep the elastic  tissues around the pelvic organs strong.  A cystocele is more likely to occur when levels of estrogen decrease.  Other causes include: heavy lifting, chronic coughing, previous pelvic surgery and obesity.  Symptoms  A bladder that has dropped from its normal position may cause: unwanted urine leakage (stress incontinence), frequent urination or urge to urinate, incomplete emptying of the bladder (not feeling bladder relief after emptying), pain or discomfort in the vagina, pelvis, groin, lower back or lower abdomen and frequent urinary tract infections.  Mild cases may not cause any symptoms.  Treatment Options  Pelvic floor (Kegel) exercises:  Strength training the muscles in your genital area  Behavioral changes: Treating and preventing constipation, taking time to empty your bladder properly, learning to lift properly and/or avoid heavy lifting when possible, stopping smoking, avoiding weight gain and treating a chronic cough or bronchitis.  A pessary: A vaginal support device is sometimes used to help pelvic support caused by muscle and ligament changes.  Surgery: Surgical repair may be necessary if symptoms cannot be  managed with exercise, behavioral changes and a pessary.  Surgery is usually considered for severe cases.   2007, [employer]

## 2020-07-12 ENCOUNTER — Telehealth: Payer: Self-pay | Admitting: Obstetrics and Gynecology

## 2020-07-12 NOTE — Telephone Encounter (Signed)
Please let the patient that I got a copy of her mammogram. They estimated her risk of breast cancer to be 22.9% Jenny Saunders). Please let her know that with this level of risk, yearly breast MRI is recommended. If she is agreeable, please set this up for March (6 months after her mammogram)

## 2020-07-14 NOTE — Telephone Encounter (Signed)
Left message to call me.

## 2020-07-21 NOTE — Telephone Encounter (Signed)
Left message to call in her voice mail.

## 2020-07-27 ENCOUNTER — Encounter: Payer: Self-pay | Admitting: Obstetrics and Gynecology

## 2020-07-27 NOTE — Telephone Encounter (Signed)
Spoke with patient and she would like the below in written so she can check with her insurance about MRI coverage. I sent a text for patient to sign up for my chart. I then told her to send a message and the below can be copied and pasted to her via my chart.

## 2020-09-27 DIAGNOSIS — D485 Neoplasm of uncertain behavior of skin: Secondary | ICD-10-CM | POA: Diagnosis not present

## 2020-09-27 DIAGNOSIS — C4441 Basal cell carcinoma of skin of scalp and neck: Secondary | ICD-10-CM | POA: Diagnosis not present

## 2020-09-30 DIAGNOSIS — Z13820 Encounter for screening for osteoporosis: Secondary | ICD-10-CM | POA: Diagnosis not present

## 2020-09-30 DIAGNOSIS — M85852 Other specified disorders of bone density and structure, left thigh: Secondary | ICD-10-CM | POA: Diagnosis not present

## 2020-09-30 DIAGNOSIS — M810 Age-related osteoporosis without current pathological fracture: Secondary | ICD-10-CM | POA: Diagnosis not present

## 2020-10-15 DIAGNOSIS — C4441 Basal cell carcinoma of skin of scalp and neck: Secondary | ICD-10-CM | POA: Diagnosis not present

## 2020-10-16 DIAGNOSIS — M810 Age-related osteoporosis without current pathological fracture: Secondary | ICD-10-CM | POA: Diagnosis not present

## 2020-12-12 DIAGNOSIS — R509 Fever, unspecified: Secondary | ICD-10-CM | POA: Diagnosis not present

## 2020-12-12 DIAGNOSIS — Z03818 Encounter for observation for suspected exposure to other biological agents ruled out: Secondary | ICD-10-CM | POA: Diagnosis not present

## 2020-12-12 DIAGNOSIS — R0981 Nasal congestion: Secondary | ICD-10-CM | POA: Diagnosis not present

## 2020-12-12 DIAGNOSIS — R5383 Other fatigue: Secondary | ICD-10-CM | POA: Diagnosis not present

## 2020-12-12 DIAGNOSIS — H8111 Benign paroxysmal vertigo, right ear: Secondary | ICD-10-CM | POA: Diagnosis not present

## 2021-02-01 DIAGNOSIS — Z23 Encounter for immunization: Secondary | ICD-10-CM | POA: Diagnosis not present

## 2021-03-08 DIAGNOSIS — Z1231 Encounter for screening mammogram for malignant neoplasm of breast: Secondary | ICD-10-CM | POA: Diagnosis not present

## 2021-03-14 DIAGNOSIS — M810 Age-related osteoporosis without current pathological fracture: Secondary | ICD-10-CM | POA: Diagnosis not present

## 2021-05-03 DIAGNOSIS — U071 COVID-19: Secondary | ICD-10-CM | POA: Diagnosis not present

## 2021-06-28 DIAGNOSIS — D1801 Hemangioma of skin and subcutaneous tissue: Secondary | ICD-10-CM | POA: Diagnosis not present

## 2021-06-28 DIAGNOSIS — Z86018 Personal history of other benign neoplasm: Secondary | ICD-10-CM | POA: Diagnosis not present

## 2021-06-28 DIAGNOSIS — L821 Other seborrheic keratosis: Secondary | ICD-10-CM | POA: Diagnosis not present

## 2021-06-28 DIAGNOSIS — Z85828 Personal history of other malignant neoplasm of skin: Secondary | ICD-10-CM | POA: Diagnosis not present

## 2021-07-15 NOTE — Progress Notes (Signed)
61 y.o. G27P2002 Married White or Caucasian Not Hispanic or Latino female here for annual exam.  No vaginal bleeding.  She uses vaginal estrogen. No dyspareunia.    She has a h/o a small grade 2 cystocele. She notices it 1-2 x a week when she is standing.  She occasionally leaks urine on the way to the bathroom with a full bladder. She leaks a small amount ~2 x a month.  She drinks a lot of tea.   No bowel issues.    No LMP recorded. Patient is postmenopausal.          Sexually active: Yes.    The current method of family planning is post menopausal status.    Exercising: Yes.     Tennis , yoga, cardio 5x a week  Smoker:  no  Health Maintenance: Pap:  06/25/19 neg,  Hr HPV  Neg  History of abnormal Pap:  no MMG:  03/08/21 report requested. Mammogram from 03/03/20 was normal. TC risk of breast cancer was 22.9%. She was offered breast MRI's.   BMD:   09/30/20 osteoporosis T-score -2.8 with primary, on Prolia Colonoscopy: 2021 with [facility] normal per patient TDaP:  utd per patient  Gardasil: n/a   reports that she has never smoked. She has never used smokeless tobacco. She reports current alcohol use of about 2.0 standard drinks per week. She reports that she does not use drugs. 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 2 grown sons  Past Medical History:  Diagnosis Date   Osteoporosis     Past Surgical History:  Procedure Laterality Date   TONSILECTOMY/ADENOIDECTOMY WITH MYRINGOTOMY Bilateral     Current Outpatient Medications  Medication Sig Dispense Refill   calcium-vitamin D (OSCAL WITH D) 500-200 MG-UNIT tablet Take 1 tablet by mouth.     fluorouracil (EFUDEX) 5 % cream Apply 1 application topically 2 (two) times daily.     NONFORMULARY OR COMPOUNDED ITEM Compounded vaginal estrogen cream 0.02%. Place 1/2 gram vaginally at hs twice weekly.  Dispense 12 grams. 3 refills. 1 each 3   sertraline (ZOLOFT) 25 MG tablet Take 25 mg by mouth daily.      PROLIA 60 MG/ML SOSY injection Inject into the skin.     No current facility-administered medications for this visit.    Family History  Problem Relation Age of Onset   Leukemia Mother        CLL turned into lymphoma.    Lung cancer Father        died at 63   Breast cancer Paternal Grandmother    Breast cancer Other 72       paternal 1/2 Aunt    Review of Systems  All other systems reviewed and are negative.  Exam:   BP 110/62    Pulse (!) 52    Ht 5\' 3"  (1.6 m)    Wt 123 lb (55.8 kg)    SpO2 98%    BMI 21.79 kg/m   Weight change: @WEIGHTCHANGE @ Height:   Height: 5\' 3"  (160 cm)  Ht Readings from Last 3 Encounters:  07/20/21 5\' 3"  (1.6 m)  07/01/20 5' 3.5" (1.613 m)  06/25/19 5' 3.5" (1.613 m)    General appearance: alert, cooperative and appears stated age Head: Normocephalic, without obvious abnormality, atraumatic Neck: no adenopathy, supple, symmetrical, trachea midline and thyroid normal to inspection and palpation Lungs: clear to auscultation bilaterally Cardiovascular: regular rate and rhythm Breasts: normal appearance, no  masses or tenderness Abdomen: soft, non-tender; non distended,  no masses,  no organomegaly Extremities: extremities normal, atraumatic, no cyanosis or edema Skin: Skin color, texture, turgor normal. No rashes or lesions Lymph nodes: Cervical, supraclavicular, and axillary nodes normal. No abnormal inguinal nodes palpated Neurologic: Grossly normal   Pelvic: External genitalia:  no lesions              Urethra:  normal appearing urethra with no masses, tenderness or lesions              Bartholins and Skenes: normal                 Vagina: mildly atrophic appearing vagina with normal color and discharge, no lesions. Small grade 2 cystocele (am appt, only examined supine with and without valsalva)              Cervix: no lesions               Bimanual Exam:  Uterus:  normal size, contour, position, consistency, mobility, non-tender               Adnexa: no mass, fullness, tenderness               Rectovaginal: Confirms               Anus:  normal sphincter tone, no lesions  Gae Dry chaperoned for the exam.  1. Well woman exam Mammogram, colonoscopy, and DEXA UTD No pap this year Labs with primary  2. History of osteoporosis Managed by her primary  3. Vaginal atrophy Will change from compounded estrogen to generic estrace - estradiol (ESTRACE) 0.1 MG/GM vaginal cream; 1 gram vaginally twice weekly  Dispense: 42.5 g; Refill: 1  4. Midline cystocele Discussed the use of a pessary, she will reach out if she wants to try one  5. Urge incontinence Mild and tolerable Discussed caffeine use and kegels

## 2021-07-20 ENCOUNTER — Ambulatory Visit (INDEPENDENT_AMBULATORY_CARE_PROVIDER_SITE_OTHER): Payer: Federal, State, Local not specified - PPO | Admitting: Obstetrics and Gynecology

## 2021-07-20 ENCOUNTER — Encounter: Payer: Self-pay | Admitting: Obstetrics and Gynecology

## 2021-07-20 ENCOUNTER — Other Ambulatory Visit: Payer: Self-pay

## 2021-07-20 VITALS — BP 110/62 | HR 52 | Ht 63.0 in | Wt 123.0 lb

## 2021-07-20 DIAGNOSIS — Z8739 Personal history of other diseases of the musculoskeletal system and connective tissue: Secondary | ICD-10-CM

## 2021-07-20 DIAGNOSIS — Z85828 Personal history of other malignant neoplasm of skin: Secondary | ICD-10-CM | POA: Insufficient documentation

## 2021-07-20 DIAGNOSIS — D2370 Other benign neoplasm of skin of unspecified lower limb, including hip: Secondary | ICD-10-CM | POA: Insufficient documentation

## 2021-07-20 DIAGNOSIS — L814 Other melanin hyperpigmentation: Secondary | ICD-10-CM | POA: Insufficient documentation

## 2021-07-20 DIAGNOSIS — N952 Postmenopausal atrophic vaginitis: Secondary | ICD-10-CM | POA: Diagnosis not present

## 2021-07-20 DIAGNOSIS — N3941 Urge incontinence: Secondary | ICD-10-CM

## 2021-07-20 DIAGNOSIS — L0100 Impetigo, unspecified: Secondary | ICD-10-CM | POA: Insufficient documentation

## 2021-07-20 DIAGNOSIS — N8111 Cystocele, midline: Secondary | ICD-10-CM

## 2021-07-20 DIAGNOSIS — C4441 Basal cell carcinoma of skin of scalp and neck: Secondary | ICD-10-CM | POA: Insufficient documentation

## 2021-07-20 DIAGNOSIS — Z01419 Encounter for gynecological examination (general) (routine) without abnormal findings: Secondary | ICD-10-CM

## 2021-07-20 MED ORDER — ESTRADIOL 0.1 MG/GM VA CREA: TOPICAL_CREAM | VAGINAL | 1 refills | Status: DC

## 2021-07-20 NOTE — Patient Instructions (Signed)
EXERCISE   We recommended that you start or continue a regular exercise program for good health. Physical activity is anything that gets your body moving, some is better than none. The CDC recommends 150 minutes per week of Moderate-Intensity Aerobic Activity and 2 or more days of Muscle Strengthening Activity.  Benefits of exercise are limitless: helps weight loss/weight maintenance, improves mood and energy, helps with depression and anxiety, improves sleep, tones and strengthens muscles, improves balance, improves bone density, protects from chronic conditions such as heart disease, high blood pressure and diabetes and so much more. To learn more visit: https://www.cdc.gov/physicalactivity/index.html  DIET: Good nutrition starts with a healthy diet of fruits, vegetables, whole grains, and lean protein sources. Drink plenty of water for hydration. Minimize empty calories, sodium, sweets. For more information about dietary recommendations visit: https://health.gov/our-work/nutrition-physical-activity/dietary-guidelines and https://www.myplate.gov/  ALCOHOL:  Women should limit their alcohol intake to no more than 7 drinks/beers/glasses of wine (combined, not each!) per week. Moderation of alcohol intake to this level decreases your risk of breast cancer and liver damage.  If you are concerned that you may have a problem, or your friends have told you they are concerned about your drinking, there are many resources to help. A well-known program that is free, effective, and available to all people all over the nation is Alcoholics Anonymous.  Check out this site to learn more: https://www.aa.org/   CALCIUM AND VITAMIN D:  Adequate intake of calcium and Vitamin D are recommended for bone health.  You should be getting between 1000-1200 mg of calcium and 800 units of Vitamin D daily between diet and supplements  PAP SMEARS:  Pap smears, to check for cervical cancer or precancers,  have traditionally been  done yearly, scientific advances have shown that most women can have pap smears less often.  However, every woman still should have a physical exam from her gynecologist every year. It will include a breast check, inspection of the vulva and vagina to check for abnormal growths or skin changes, a visual exam of the cervix, and then an exam to evaluate the size and shape of the uterus and ovaries. We will also provide age appropriate advice regarding health maintenance, like when you should have certain vaccines, screening for sexually transmitted diseases, bone density testing, colonoscopy, mammograms, etc.   MAMMOGRAMS:  All women over 40 years old should have a routine mammogram.   COLON CANCER SCREENING: Now recommend starting at age 45. At this time colonoscopy is not covered for routine screening until 50. There are take home tests that can be done between 45-49.   COLONOSCOPY:  Colonoscopy to screen for colon cancer is recommended for all women at age 50.  We know, you hate the idea of the prep.  We agree, BUT, having colon cancer and not knowing it is worse!!  Colon cancer so often starts as a polyp that can be seen and removed at colonscopy, which can quite literally save your life!  And if your first colonoscopy is normal and you have no family history of colon cancer, most women don't have to have it again for 10 years.  Once every ten years, you can do something that may end up saving your life, right?  We will be happy to help you get it scheduled when you are ready.  Be sure to check your insurance coverage so you understand how much it will cost.  It may be covered as a preventative service at no cost, but you should check   your particular policy.      Breast Self-Awareness Breast self-awareness means being familiar with how your breasts look and feel. It involves checking your breasts regularly and reporting any changes to your health care provider. Practicing breast self-awareness is  important. A change in your breasts can be a sign of a serious medical problem. Being familiar with how your breasts look and feel allows you to find any problems early, when treatment is more likely to be successful. All women should practice breast self-awareness, including women who have had breast implants. How to do a breast self-exam One way to learn what is normal for your breasts and whether your breasts are changing is to do a breast self-exam. To do a breast self-exam: Look for Changes  Remove all the clothing above your waist. Stand in front of a mirror in a room with good lighting. Put your hands on your hips. Push your hands firmly downward. Compare your breasts in the mirror. Look for differences between them (asymmetry), such as: Differences in shape. Differences in size. Puckers, dips, and bumps in one breast and not the other. Look at each breast for changes in your skin, such as: Redness. Scaly areas. Look for changes in your nipples, such as: Discharge. Bleeding. Dimpling. Redness. A change in position. Feel for Changes Carefully feel your breasts for lumps and changes. It is best to do this while lying on your back on the floor and again while sitting or standing in the shower or tub with soapy water on your skin. Feel each breast in the following way: Place the arm on the side of the breast you are examining above your head. Feel your breast with the other hand. Start in the nipple area and make  inch (2 cm) overlapping circles to feel your breast. Use the pads of your three middle fingers to do this. Apply light pressure, then medium pressure, then firm pressure. The light pressure will allow you to feel the tissue closest to the skin. The medium pressure will allow you to feel the tissue that is a little deeper. The firm pressure will allow you to feel the tissue close to the ribs. Continue the overlapping circles, moving downward over the breast until you feel your  ribs below your breast. Move one finger-width toward the center of the body. Continue to use the  inch (2 cm) overlapping circles to feel your breast as you move slowly up toward your collarbone. Continue the up and down exam using all three pressures until you reach your armpit.  Write Down What You Find  Write down what is normal for each breast and any changes that you find. Keep a written record with breast changes or normal findings for each breast. By writing this information down, you do not need to depend only on memory for size, tenderness, or location. Write down where you are in your menstrual cycle, if you are still menstruating. If you are having trouble noticing differences in your breasts, do not get discouraged. With time you will become more familiar with the variations in your breasts and more comfortable with the exam. How often should I examine my breasts? Examine your breasts every month. If you are breastfeeding, the best time to examine your breasts is after a feeding or after using a breast pump. If you menstruate, the best time to examine your breasts is 5-7 days after your period is over. During your period, your breasts are lumpier, and it may be more   difficult to notice changes. When should I see my health care provider? See your health care provider if you notice: A change in shape or size of your breasts or nipples. A change in the skin of your breast or nipples, such as a reddened or scaly area. Unusual discharge from your nipples. A lump or thick area that was not there before. Pain in your breasts. Anything that concerns you. Urinary Incontinence Urinary incontinence refers to a condition in which a person is unable to control where and when to pass urine. A person with this condition will urinate involuntarily. This means that the person urinates when he or she does not mean to. What are the causes? This condition may be caused  by: Medicines. Infections. Constipation. Overactive bladder muscles. Weak bladder muscles. Weak pelvic floor muscles. These muscles provide support for the bladder, intestine, and, in women, the uterus. Enlarged prostate in men. The prostate is a gland near the bladder. When it gets too big, it can pinch the urethra. With the urethra blocked, the bladder can weaken and lose the ability to empty properly. Surgery. Emotional factors, such as anxiety, stress, or post-traumatic stress disorder (PTSD). Spinal cord injury, nerve injury, or other neurological conditions. Pelvic organ prolapse. This happens in women when organs move out of place and into the vagina. This movement can prevent the bladder and urethra from working properly. What increases the risk? The following factors may make you more likely to develop this condition: Age. The older you are, the higher the risk. Obesity. Being physically inactive. Pregnancy and childbirth. Menopause. Diseases that affect the nerves or spinal cord. Long-term, or chronic, coughing. This can increase pressure on the bladder and pelvic floor muscles. What are the signs or symptoms? Symptoms may vary depending on the type of urinary incontinence you have. They include: A sudden urge to urinate, and passing urine involuntarily before you can get to a bathroom (urge incontinence). Suddenly passing urine when doing activities that force urine to pass, such as coughing, laughing, exercising, or sneezing (stress incontinence). Needing to urinate often but urinating only a small amount, or constantly dribbling urine (overflow incontinence). Urinating because you cannot get to the bathroom in time due to a physical disability, such as arthritis or injury, or due to a communication or thinking problem, such as Alzheimer's disease (functional incontinence). How is this diagnosed? This condition may be diagnosed based on: Your medical history. A physical  exam. Tests, such as: Urine tests. X-rays of your kidney and bladder. Ultrasound. CT scan. Cystoscopy. In this procedure, a health care provider inserts a tube with a light and camera (cystoscope) through the urethra and into the bladder to check for problems. Urodynamic testing. These tests assess how well the bladder, urethra, and sphincter can store and release urine. There are different types of urodynamic tests, and they vary depending on what the test is measuring. To help diagnose your condition, your health care provider may recommend that you keep a log of when you urinate and how much you urinate. How is this treated? Treatment for this condition depends on the type of incontinence that you have and its cause. Treatment may include: Lifestyle changes, such as: Quitting smoking. Maintaining a healthy weight. Staying active. Try to get 150 minutes of moderate-intensity exercise every week. Ask your health care provider which activities are safe for you. Eating a healthy diet. Avoid high-fat foods, like fried foods. Avoid refined carbohydrates like white bread and white rice. Limit how much alcohol  and caffeine you drink. Increase your fiber intake. Healthy sources of fiber include beans, whole grains, and fresh fruits and vegetables. Behavioral changes, such as: Pelvic floor muscle exercises. Bladder training, such as lengthening the amount of time between bathroom breaks, or using the bathroom at regular intervals. Using techniques to suppress bladder urges. This can include distraction techniques or controlled breathing exercises. Medicines, such as: Medicines to relax the bladder muscles and prevent bladder spasms. Medicines to help slow or prevent the growth of a man's prostate. Botox injections. These can help relax the bladder muscles. Treatments, such as: Using pulses of electricity to help change bladder reflexes (electrical nerve stimulation). For women, using a medical  device to prevent urine leaks. This is a small, tampon-like, disposable device that is inserted into the urethra. Injecting collagen or carbon beads (bulking agents) into the urinary sphincter. These can help thicken tissue and close the bladder opening. Surgery. Follow these instructions at home: Lifestyle Limit alcohol and caffeine. These can fill your bladder quickly and irritate it. Keep yourself clean to help prevent odors and skin damage. Ask your health care provider about special skin creams and cleansers that can protect the skin from urine. Consider wearing pads or adult diapers. Make sure to change them regularly, and always change them right after experiencing incontinence. General instructions Take over-the-counter and prescription medicines only as told by your health care provider. Use the bathroom about every 3-4 hours, even if you do not feel the need to urinate. Try to empty your bladder completely every time. After urinating, wait a minute. Then try to urinate again. Make sure you are in a relaxed position while urinating. If your incontinence is caused by nerve problems, keep a log of the medicines you take and the times you go to the bathroom. Keep all follow-up visits. This is important. Where to find more information [employer] Diabetes and Digestive and Kidney Diseases: [employer] American Urology Association: www.urologyhealth.org Contact a health care provider if: You have pain that gets worse. Your incontinence gets worse. Get help right away if: You have a fever or chills. You are unable to urinate. You have redness in your groin area or down your legs. Summary Urinary incontinence refers to a condition in which a person is unable to control where and when to pass urine. This condition may be caused by medicines, infection, weak bladder muscles, weak pelvic floor muscles, enlargement of the prostate (in men), or surgery. Factors such as older  age, obesity, pregnancy and childbirth, menopause, neurological diseases, and chronic coughing may increase your risk for developing this condition. Types of urinary incontinence include urge incontinence, stress incontinence, overflow incontinence, and functional incontinence. This condition is usually treated first with lifestyle and behavioral changes, such as quitting smoking, eating a healthier diet, and doing regular pelvic floor exercises. Other treatment options include medicines, bulking agents, medical devices, electrical nerve stimulation, or surgery. This information is not intended to replace advice given to you by your health care provider. Make sure you discuss any questions you have with your health care provider. Document Revised: 01/16/2020 Document Reviewed: 01/16/2020 Elsevier Patient Education  [place].

## 2021-07-26 DIAGNOSIS — Z0000 Encounter for general adult medical examination without abnormal findings: Secondary | ICD-10-CM | POA: Diagnosis not present

## 2021-07-26 DIAGNOSIS — Z13220 Encounter for screening for lipoid disorders: Secondary | ICD-10-CM | POA: Diagnosis not present

## 2021-07-28 DIAGNOSIS — M810 Age-related osteoporosis without current pathological fracture: Secondary | ICD-10-CM | POA: Diagnosis not present

## 2021-07-28 DIAGNOSIS — Z23 Encounter for immunization: Secondary | ICD-10-CM | POA: Diagnosis not present

## 2021-07-28 DIAGNOSIS — H9313 Tinnitus, bilateral: Secondary | ICD-10-CM | POA: Diagnosis not present

## 2021-07-28 DIAGNOSIS — Z0000 Encounter for general adult medical examination without abnormal findings: Secondary | ICD-10-CM | POA: Diagnosis not present

## 2021-09-14 DIAGNOSIS — M810 Age-related osteoporosis without current pathological fracture: Secondary | ICD-10-CM | POA: Diagnosis not present

## 2021-10-17 DIAGNOSIS — L570 Actinic keratosis: Secondary | ICD-10-CM | POA: Diagnosis not present

## 2021-10-17 DIAGNOSIS — Z872 Personal history of diseases of the skin and subcutaneous tissue: Secondary | ICD-10-CM | POA: Diagnosis not present

## 2021-11-01 DIAGNOSIS — H43811 Vitreous degeneration, right eye: Secondary | ICD-10-CM | POA: Diagnosis not present

## 2022-01-10 ENCOUNTER — Encounter: Payer: Self-pay | Admitting: Obstetrics and Gynecology

## 2022-01-10 ENCOUNTER — Ambulatory Visit (INDEPENDENT_AMBULATORY_CARE_PROVIDER_SITE_OTHER): Payer: Federal, State, Local not specified - PPO | Admitting: Obstetrics and Gynecology

## 2022-01-10 VITALS — BP 118/68 | Wt 124.8 lb

## 2022-01-10 DIAGNOSIS — N814 Uterovaginal prolapse, unspecified: Secondary | ICD-10-CM

## 2022-01-10 DIAGNOSIS — N8111 Cystocele, midline: Secondary | ICD-10-CM | POA: Diagnosis not present

## 2022-01-10 NOTE — Progress Notes (Signed)
GYNECOLOGY  VISIT   HPI: 61 y.o.   Married White or Caucasian Not Hispanic or Latino  female   [phone] with No LMP recorded. Patient is postmenopausal.   here for possible prolapse. She feels an increasing bulge at the opening of her vagina. Notices it off and on throughout the day. No pain, no bleeding. No issues emptying her bowel or bladder. Rare urge incontinence, small amounts, not an issue.   Sexually active, no pain.  She was previously noted to have a small grade 2 cystocele.  GYNECOLOGIC HISTORY: No LMP recorded. Patient is postmenopausal. 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     estradiol (ESTRACE) 0.1 MG/GM vaginal cream 1 gram vaginally twice weekly 42.5 g 1   PROLIA 60 MG/ML SOSY injection Inject into the skin.     sertraline (ZOLOFT) 25 MG tablet Take 25 mg by mouth daily.     No current facility-administered medications for this visit.     ALLERGIES: Patient has no known allergies.  Family History  Problem Relation Age of Onset   Leukemia Mother        CLL turned into lymphoma.    Lung cancer Father         died at 75   Breast cancer Paternal Grandmother    Breast cancer Other 22       paternal 1/2 74    Social History   Socioeconomic History   Marital status: Married    Spouse name: Not on file   Number of children: Not on file   Years of education: Not on file   Highest education level: Not on file  Occupational History   Not on file  Tobacco Use   Smoking status: Never   Smokeless tobacco: Never  Vaping Use   Vaping Use: Never used  Substance and Sexual Activity   Alcohol use: Yes    Alcohol/week: 2.0 standard drinks of alcohol    Types: 2 Glasses of wine per week   Drug use: Never   Sexual activity: Yes  Other Topics Concern   Not on file  Social History Narrative   Not on file   Social Determinants of Health   Financial Resource Strain: Not on file  Food Insecurity: Not on file  Transportation Needs: Not on file  Physical Activity: Not on file  Stress: Not on file  Social Connections: Not on file  Intimate Partner Violence: Not on file    Review of Systems  All other systems reviewed and are negative.   PHYSICAL EXAMINATION:    BP 118/68   Wt 124 lb 12.8 oz (56.6 kg)   BMI 22.11 kg/m     General appearance: alert, cooperative and appears stated age  Pelvic: External genitalia:  no lesions              Urethra:  normal appearing urethra with no masses, tenderness or lesions              Bartholins and Skenes: normal                 Vagina: mildly atrophic appearing vagina with normal color and discharge, no lesions. With valsalva and standing the patient has a large grade 2 cystocele (1 cm out of the introitus), and a grade 1-2 uterine prolapse. No significant rectocle              Cervix: no lesions              Bimanual Exam:  Uterus:  normal size, contour, position, consistency, mobility, non-tender              Adnexa: no mass, fullness, tenderness               Chaperone was present for exam.  1. Midline cystocele -Reviewed prolapse,  ACOG handout given and diagrams reviewed -Discussed option of doing nothing, pessary and surgery -She is interested in a pessary. I showed her the pessaries and discussed use. She is already on vaginal estrogen -She will return for a pessary fitting  2. Uterine prolapse See above  27 minutes in total patient care

## 2022-01-18 ENCOUNTER — Ambulatory Visit: Payer: Federal, State, Local not specified - PPO | Admitting: Obstetrics and Gynecology

## 2022-01-19 ENCOUNTER — Encounter: Payer: Self-pay | Admitting: Obstetrics and Gynecology

## 2022-01-19 ENCOUNTER — Ambulatory Visit: Payer: Federal, State, Local not specified - PPO | Admitting: Obstetrics and Gynecology

## 2022-01-19 VITALS — BP 118/80 | HR 62 | Ht 63.0 in | Wt 124.0 lb

## 2022-01-19 DIAGNOSIS — N8111 Cystocele, midline: Secondary | ICD-10-CM

## 2022-01-19 DIAGNOSIS — N814 Uterovaginal prolapse, unspecified: Secondary | ICD-10-CM

## 2022-01-19 NOTE — Progress Notes (Signed)
GYNECOLOGY  VISIT   HPI: 61 y.o.   Married White or Caucasian Not Hispanic or Latino  female   [phone] with No LMP recorded. Patient is postmenopausal.   here for  pessary fitting for a grade 2 midline cystocele and grade 1-2 uterine prolapse.   GYNECOLOGIC HISTORY: No LMP recorded. Patient is postmenopausal. 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     estradiol (ESTRACE) 0.1 MG/GM vaginal cream 1 gram vaginally twice weekly 42.5 g 1   PROLIA 60 MG/ML SOSY injection Inject into the skin.     sertraline (ZOLOFT) 25 MG tablet Take 25 mg by mouth daily.     No current facility-administered medications for this visit.     ALLERGIES: Patient has no known allergies.  Family History  Problem Relation Age of Onset   Leukemia Mother        CLL turned into lymphoma.    Lung cancer Father        died at 7   Breast cancer Paternal Grandmother    Breast cancer Other 56       paternal 1/2 21    Social History   Socioeconomic History   Marital status: Married    Spouse name: Not on file   Number of children: Not  on file   Years of education: Not on file   Highest education level: Not on file  Occupational History   Not on file  Tobacco Use   Smoking status: Never   Smokeless tobacco: Never  Vaping Use   Vaping Use: Never used  Substance and Sexual Activity   Alcohol use: Yes    Alcohol/week: 2.0 standard drinks of alcohol    Types: 2 Glasses of wine per week   Drug use: Never   Sexual activity: Yes    Birth control/protection: Post-menopausal  Other Topics Concern   Not on file  Social History Narrative   Not on file   Social Determinants of Health   Financial Resource Strain: Not on file  Food Insecurity: Not on file  Transportation Needs: Not on file  Physical Activity: Not on file  Stress: Not on file  Social Connections: Not on file  Intimate Partner Violence: Not on file    ROS  PHYSICAL EXAMINATION:    BP 118/80 (BP Location:  Right Arm, Patient Position: Sitting, Cuff Size: Normal)   Pulse 62   Ht '5\' 3"'$  (1.6 m)   Wt 124 lb (56.2 kg)   BMI 21.97 kg/m     General appearance: alert, cooperative and appears stated age   Pelvic: External genitalia:  no lesions              Urethra:  normal appearing urethra with no masses, tenderness or lesions              Bartholins and Skenes: normal                 Vagina: fitted with a #4 pessary, then a #5 pessary. Felt comfortable in the office with a #5. Sent home with the #5.   Chaperone was present for exam.  1. Midline cystocele Fitted with a # 5 ring pessary with support. Comfortable in the office. Sent home with the pessary F/U on Monday Call with any concerns  2. Uterine prolapse

## 2022-01-23 ENCOUNTER — Encounter: Payer: Self-pay | Admitting: Obstetrics and Gynecology

## 2022-01-23 ENCOUNTER — Ambulatory Visit (INDEPENDENT_AMBULATORY_CARE_PROVIDER_SITE_OTHER): Payer: Federal, State, Local not specified - PPO | Admitting: Obstetrics and Gynecology

## 2022-01-23 VITALS — BP 110/64 | HR 72 | Wt 124.0 lb

## 2022-01-23 DIAGNOSIS — Z4689 Encounter for fitting and adjustment of other specified devices: Secondary | ICD-10-CM | POA: Diagnosis not present

## 2022-01-23 NOTE — Progress Notes (Signed)
GYNECOLOGY  VISIT   HPI: 61 y.o.   Married White or Caucasian Not Hispanic or Latino  female   [phone] with No LMP recorded. Patient is postmenopausal.   here for pessary follow up. Patient states that she had some cramping the first day. She states now she doesn't feel it, just feels comfortable. Voiding well, normal BM. No discomfort, no bleeding.      GYNECOLOGIC HISTORY: No LMP recorded. Patient is postmenopausal. 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     estradiol (ESTRACE) 0.1 MG/GM vaginal cream 1 gram vaginally twice weekly 42.5 g 1   PROLIA 60 MG/ML SOSY injection Inject into the skin.     sertraline (ZOLOFT) 25 MG tablet Take 25 mg by mouth daily.     No current facility-administered medications for this visit.     ALLERGIES: Patient has no known allergies.  Family History  Problem Relation Age of Onset   Leukemia Mother        CLL turned into lymphoma.    Lung cancer Father        died at 64   Breast cancer Paternal Grandmother    Breast cancer Other 31       paternal 1/2 14    Social History    Socioeconomic History   Marital status: Married    Spouse name: Not on file   Number of children: Not on file   Years of education: Not on file   Highest education level: Not on file  Occupational History   Not on file  Tobacco Use   Smoking status: Never   Smokeless tobacco: Never  Vaping Use   Vaping Use: Never used  Substance and Sexual Activity   Alcohol use: Yes    Alcohol/week: 2.0 standard drinks of alcohol    Types: 2 Glasses of wine per week   Drug use: Never   Sexual activity: Yes    Birth control/protection: Post-menopausal  Other Topics Concern   Not on file  Social History Narrative   Not on file   Social Determinants of Health   Financial Resource Strain: Not on file  Food Insecurity: Not on file  Transportation Needs: Not on file  Physical Activity: Not on file  Stress: Not on file  Social Connections: Not on file  Intimate Partner Violence: Not on file    Review of Systems  All other systems reviewed and are negative.   PHYSICAL EXAMINATION:    BP 110/64   Pulse 72   Wt 124 lb (56.2 kg)   SpO2 100%   BMI 21.97 kg/m     General appearance: alert, cooperative and appears stated age  Pelvic: External genitalia:  no lesions              Urethra:  normal appearing urethra with no masses, tenderness or lesions              Bartholins and Skenes: normal                 Vagina: the pessary was removed and cleaned. No vaginal irritation. The pessary was replaced.               Cervix: no lesions               Chaperone was present for exam.  1. Pessary maintenance Doing well, f/u in 2 weeks

## 2022-02-08 ENCOUNTER — Ambulatory Visit: Payer: Federal, State, Local not specified - PPO | Admitting: Obstetrics and Gynecology

## 2022-02-28 ENCOUNTER — Ambulatory Visit (INDEPENDENT_AMBULATORY_CARE_PROVIDER_SITE_OTHER): Payer: Federal, State, Local not specified - PPO | Admitting: Obstetrics and Gynecology

## 2022-02-28 ENCOUNTER — Encounter: Payer: Self-pay | Admitting: Obstetrics and Gynecology

## 2022-02-28 VITALS — BP 108/62 | Wt 124.0 lb

## 2022-02-28 DIAGNOSIS — Z4689 Encounter for fitting and adjustment of other specified devices: Secondary | ICD-10-CM | POA: Diagnosis not present

## 2022-02-28 DIAGNOSIS — N952 Postmenopausal atrophic vaginitis: Secondary | ICD-10-CM | POA: Diagnosis not present

## 2022-02-28 NOTE — Progress Notes (Signed)
GYNECOLOGY  VISIT   HPI: 61 y.o.   Married White or Caucasian Not Hispanic or Latino  female   [phone] with No LMP recorded. Patient is postmenopausal.   here for follow up pessary maintenance. She has a h/o a grade 2 cystocele and grade 1-2 uterine prolapse. She was fitted with a #5 ring pessary with support at the end of July, 2023. She uses estrogen cream 2 x a week.  She only takes the pessary out to have sex. No bleeding, no pain. Feels uncomfortable. Normal bowel and bladder function.  GYNECOLOGIC HISTORY: No LMP recorded. Patient is postmenopausal. Contraception:pmp  Menopausal hormone therapy: yes, estrace cream.         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     estradiol (ESTRACE) 0.1 MG/GM vaginal cream 1 gram vaginally twice weekly 42.5 g 1   PROLIA 60 MG/ML SOSY injection Inject into the skin.     sertraline (ZOLOFT) 25 MG tablet Take 25 mg by mouth daily.     No current facility-administered medications for this visit.     ALLERGIES: Patient has no known allergies.  Family History  Problem Relation Age of Onset   Leukemia Mother        CLL turned into lymphoma.    Lung  cancer Father        died at 68   Breast cancer Paternal Grandmother    Breast cancer Other 63       paternal 1/2 73    Social History   Socioeconomic History   Marital status: Married    Spouse name: Not on file   Number of children: Not on file   Years of education: Not on file   Highest education level: Not on file  Occupational History   Not on file  Tobacco Use   Smoking status: Never   Smokeless tobacco: Never  Vaping Use   Vaping Use: Never used  Substance and Sexual Activity   Alcohol use: Yes    Alcohol/week: 2.0 standard drinks of alcohol    Types: 2 Glasses of wine per week   Drug use: Never   Sexual activity: Yes    Birth control/protection: Post-menopausal  Other Topics Concern   Not on file  Social History Narrative   Not on file   Social Determinants of Health   Financial Resource Strain: Not on file  Food Insecurity: Not on file  Transportation Needs: Not on file  Physical Activity: Not on file  Stress: Not on file  Social Connections: Not on file  Intimate Partner Violence: Not on file    Review of Systems  All other systems reviewed and are negative.   PHYSICAL EXAMINATION:    BP 108/62   Wt 124 lb (56.2 kg)   BMI 21.97 kg/m     General appearance: alert, cooperative and appears stated age   Pelvic: External genitalia:  no lesions              Urethra:  normal appearing urethra with no masses, tenderness or lesions              Bartholins and Skenes: normal                 Vagina: the pessary was removed and cleaned, very minimal erythema on the posterior vaginal wall near the apex. No erosions, no ulcerations, no granulation tissue. The pessary was replaced.               Cervix: no lesions               Chaperone was present for exam.  1. Pessary maintenance Doing very well. Very minimal erythema on the posterior vaginal apex. Recommended that she take the pessary out overnight 2 nights a week when she uses the vaginal  estrogen. -F/U in 3 months  2. Vaginal atrophy Continue with 2 x a week vaginal estrogen.

## 2022-03-01 ENCOUNTER — Ambulatory Visit: Payer: Federal, State, Local not specified - PPO | Admitting: Obstetrics and Gynecology

## 2022-03-13 DIAGNOSIS — Z1231 Encounter for screening mammogram for malignant neoplasm of breast: Secondary | ICD-10-CM | POA: Diagnosis not present

## 2022-03-14 ENCOUNTER — Encounter: Payer: Self-pay | Admitting: Obstetrics and Gynecology

## 2022-03-22 DIAGNOSIS — M810 Age-related osteoporosis without current pathological fracture: Secondary | ICD-10-CM | POA: Diagnosis not present

## 2022-04-27 DIAGNOSIS — Z23 Encounter for immunization: Secondary | ICD-10-CM | POA: Diagnosis not present

## 2022-05-31 ENCOUNTER — Encounter: Payer: Self-pay | Admitting: Obstetrics and Gynecology

## 2022-05-31 ENCOUNTER — Ambulatory Visit (INDEPENDENT_AMBULATORY_CARE_PROVIDER_SITE_OTHER): Payer: Federal, State, Local not specified - PPO | Admitting: Obstetrics and Gynecology

## 2022-05-31 VITALS — BP 120/62 | HR 66 | Wt 124.0 lb

## 2022-05-31 DIAGNOSIS — N952 Postmenopausal atrophic vaginitis: Secondary | ICD-10-CM | POA: Diagnosis not present

## 2022-05-31 DIAGNOSIS — Z4689 Encounter for fitting and adjustment of other specified devices: Secondary | ICD-10-CM

## 2022-05-31 DIAGNOSIS — T8389XA Other specified complication of genitourinary prosthetic devices, implants and grafts, initial encounter: Secondary | ICD-10-CM

## 2022-05-31 NOTE — Progress Notes (Signed)
GYNECOLOGY  VISIT   HPI: 61 y.o.   Married White or Caucasian Not Hispanic or Latino  female   [phone] with No LMP recorded. Patient is postmenopausal.   here for pessary maintenance. She has a h/o a grade 2 cystocele and grade 1-2 uterine prolapse. She was fitted with a #5 ring pessary with support at the end of July, 2023. She uses estrogen cream 2 x a week. She takes the pessary out overnight every 1-2 weeks. No bowel or bladder c/o.    GYNECOLOGIC HISTORY: No LMP recorded. Patient is postmenopausal. 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     estradiol (ESTRACE) 0.1 MG/GM vaginal cream 1 gram vaginally twice weekly 42.5 g 1   PROLIA 60 MG/ML SOSY injection Inject into the skin.     sertraline (ZOLOFT) 25 MG tablet Take 25 mg by mouth daily.     No current facility-administered medications for this visit.     ALLERGIES: Patient has no known allergies.  Family History  Problem Relation Age of Onset   Leukemia Mother        CLL turned into lymphoma.    Lung cancer Father        died at 28   Breast cancer Paternal  Grandmother    Breast cancer Other 62       paternal 1/2 47    Social History   Socioeconomic History   Marital status: Married    Spouse name: Not on file   Number of children: Not on file   Years of education: Not on file   Highest education level: Not on file  Occupational History   Not on file  Tobacco Use   Smoking status: Never   Smokeless tobacco: Never  Vaping Use   Vaping Use: Never used  Substance and Sexual Activity   Alcohol use: Yes    Alcohol/week: 2.0 standard drinks of alcohol    Types: 2 Glasses of wine per week   Drug use: Never   Sexual activity: Yes    Birth control/protection: Post-menopausal  Other Topics Concern   Not on file  Social History Narrative   Not on file   Social Determinants of Health   Financial Resource Strain: Not on file  Food Insecurity: Not on file  Transportation Needs:  Not on file  Physical Activity: Not on file  Stress: Not on file  Social Connections: Not on file  Intimate Partner Violence: Not on file    Review of Systems  All other systems reviewed and are negative.   PHYSICAL EXAMINATION:    BP 120/62   Pulse 66   Wt 124 lb (56.2 kg)   SpO2 100%   BMI 21.97 kg/m     General appearance: alert, cooperative and appears stated age  Pelvic: External genitalia:  no lesions              Urethra:  normal appearing urethra with no masses, tenderness or lesions              Bartholins and Skenes: normal                 Vagina: the pessary was removed and cleaned, slight erythema in the posterior vaginal apex.               Cervix: no lesions               Chaperone was present for exam.  1. Pessary maintenance Overall doing very well, happy with the pessary F/U in 3 months for an annual exam and pessary check  2. Vaginal atrophy Helped with vaginal estrogen  3. Vaginal irritation from pessary (HCC) Slight irritation posteriorly She will take the pessary out overnight 2 x a week when she uses the vaginal  estrogen

## 2022-07-26 ENCOUNTER — Ambulatory Visit: Payer: Federal, State, Local not specified - PPO | Admitting: Obstetrics and Gynecology

## 2022-08-01 DIAGNOSIS — Z13220 Encounter for screening for lipoid disorders: Secondary | ICD-10-CM | POA: Diagnosis not present

## 2022-08-01 DIAGNOSIS — Z0000 Encounter for general adult medical examination without abnormal findings: Secondary | ICD-10-CM | POA: Diagnosis not present

## 2022-08-03 ENCOUNTER — Other Ambulatory Visit: Payer: Self-pay | Admitting: Family Medicine

## 2022-08-03 DIAGNOSIS — Z0000 Encounter for general adult medical examination without abnormal findings: Secondary | ICD-10-CM | POA: Diagnosis not present

## 2022-08-03 DIAGNOSIS — M810 Age-related osteoporosis without current pathological fracture: Secondary | ICD-10-CM

## 2022-08-21 NOTE — Progress Notes (Signed)
62 y.o. [id number] Married White or Caucasian Not Hispanic or Latino female here for annual exam.    She has a h/o a grade 2 cystocele and grade 1-2 uterine prolapse. She was fitted with a #5 ring pessary with support at the end of July, 2023. She uses estrogen cream 2 x a week. She takes the pessary out overnight every 1-2 weeks.  No vaginal bleeding. No dyspareunia.   No bowel or bladder c/o. She hasn't had issues with incontinence since using the pessary.   Mammogram from 2021 calculated her TC risk of breast cancer at 22.9%. The last 2 mammograms haven't mentioned it. She was previously offered breast MRI and didn't do it.   No LMP recorded. Patient is postmenopausal.          Sexually active: Yes.    The current method of family planning is post menopausal status.    Exercising: Yes.     Cardio  Smoker:  no  Health Maintenance: Pap:  06/25/19 neg,  Hr HPV  Neg  History of abnormal Pap:  no MMG:  03/14/22 Bi-rads 1 neg  BMD:   09/30/20 osteoporosis T-score -2.8 with primary, on Prolia Colonoscopy: 2021 with [facility] normal per patient TDaP:  utd per patient  Gardasil: n/a   reports that she has never smoked. She has never used smokeless tobacco. She reports current alcohol use of about 2.0 standard drinks of alcohol per week. She reports that she does not use drugs. 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 2 grown sons.   Past Medical History:  Diagnosis Date   Osteoporosis     Past Surgical History:  Procedure Laterality Date   TONSILECTOMY/ADENOIDECTOMY WITH MYRINGOTOMY Bilateral     Current Outpatient Medications  Medication Sig Dispense Refill   calcium-vitamin D (OSCAL WITH D) 500-200 MG-UNIT tablet Take 1 tablet by mouth.     estradiol (ESTRACE) 0.1 MG/GM vaginal cream 1 gram vaginally twice weekly 42.5 g 1   PROLIA 60 MG/ML SOSY injection Inject into the skin.     sertraline (ZOLOFT) 25 MG tablet Take 25 mg by mouth daily.     No  current facility-administered medications for this visit.    Family History  Problem Relation Age of Onset   Leukemia Mother        CLL turned into lymphoma.    Lung cancer Father        died at 63   Breast cancer Paternal Grandmother    Breast cancer Other 46       paternal 1/2 Aunt    Review of Systems  All other systems reviewed and are negative.   Exam:   BP 100/62   Pulse 68   Wt 119 lb (54 kg)   SpO2 100%   BMI 21.08 kg/m   Weight change: '@WEIGHTCHANGE'$ @ Height:      Ht Readings from Last 3 Encounters:  01/19/22 '5\' 3"'$  (1.6 m)  07/20/21 '5\' 3"'$  (1.6 m)  07/01/20 5' 3.5" (1.613 m)    General appearance: alert, cooperative and appears stated age Head: Normocephalic, without obvious abnormality, atraumatic Neck: no adenopathy, supple, symmetrical, trachea midline and thyroid normal to inspection and palpation Lungs: clear to auscultation bilaterally Cardiovascular: regular rate and rhythm Breasts: normal appearance, no masses or tenderness Abdomen: soft, non-tender; non distended,  no masses,  no organomegaly Extremities: extremities normal, atraumatic, no cyanosis or edema Skin: Skin color, texture, turgor normal. No rashes or lesions  Lymph nodes: Cervical, supraclavicular, and axillary nodes normal. No abnormal inguinal nodes palpated Neurologic: Grossly normal   Pelvic: External genitalia:  no lesions              Urethra:  normal appearing urethra with no masses, tenderness or lesions              Bartholins and Skenes: normal                 Vagina: the pessary was removed and cleaned, no vaginal irritation. The pessary was replaced.               Cervix: no lesions               Bimanual Exam:  Uterus:  normal size, contour, position, consistency, mobility, non-tender              Adnexa: no mass, fullness, tenderness               Rectovaginal: Confirms               Anus:  normal sphincter tone, no lesions  Gae Dry, CMA chaperoned for the exam.  1.  Well woman exam Discussed breast self exam Discussed calcium and vit D intake Colonoscopy UTD Pap UTD Mammogram UTD We discussed her previously calculated TC risk of breast cancer of 22.9% ([facility]), we discussed the recommendations for MRI's, the possible risks of the IV contrast used. She will reach out to [facility], they didn't comment on her risk the last 2 mammograms. We also discussed her running the TC calculator on her own. She will reach out if she wants me to schedule a breast MRI for her.   2. Pessary maintenance Doing well F/U in 3 months  3. Vaginal atrophy - estradiol (ESTRACE) 0.1 MG/GM vaginal cream; 1 gram vaginally twice weekly  Dispense: 42.5 g; Refill: 1

## 2022-08-29 ENCOUNTER — Ambulatory Visit (INDEPENDENT_AMBULATORY_CARE_PROVIDER_SITE_OTHER): Payer: Federal, State, Local not specified - PPO | Admitting: Obstetrics and Gynecology

## 2022-08-29 ENCOUNTER — Encounter: Payer: Self-pay | Admitting: Obstetrics and Gynecology

## 2022-08-29 VITALS — BP 100/62 | HR 68 | Wt 119.0 lb

## 2022-08-29 DIAGNOSIS — N952 Postmenopausal atrophic vaginitis: Secondary | ICD-10-CM

## 2022-08-29 DIAGNOSIS — Z01419 Encounter for gynecological examination (general) (routine) without abnormal findings: Secondary | ICD-10-CM | POA: Diagnosis not present

## 2022-08-29 DIAGNOSIS — Z4689 Encounter for fitting and adjustment of other specified devices: Secondary | ICD-10-CM | POA: Diagnosis not present

## 2022-08-29 MED ORDER — ESTRADIOL 0.1 MG/GM VA CREA: TOPICAL_CREAM | VAGINAL | 1 refills | Status: DC

## 2022-08-29 NOTE — Patient Instructions (Signed)

## 2022-09-26 DIAGNOSIS — L821 Other seborrheic keratosis: Secondary | ICD-10-CM | POA: Diagnosis not present

## 2022-09-26 DIAGNOSIS — Z85828 Personal history of other malignant neoplasm of skin: Secondary | ICD-10-CM | POA: Diagnosis not present

## 2022-09-26 DIAGNOSIS — L814 Other melanin hyperpigmentation: Secondary | ICD-10-CM | POA: Diagnosis not present

## 2022-09-26 DIAGNOSIS — Z86018 Personal history of other benign neoplasm: Secondary | ICD-10-CM | POA: Diagnosis not present

## 2022-09-28 DIAGNOSIS — M810 Age-related osteoporosis without current pathological fracture: Secondary | ICD-10-CM | POA: Diagnosis not present

## 2022-12-07 ENCOUNTER — Ambulatory Visit: Payer: Federal, State, Local not specified - PPO | Admitting: Obstetrics and Gynecology

## 2022-12-07 ENCOUNTER — Encounter: Payer: Self-pay | Admitting: Obstetrics and Gynecology

## 2022-12-07 VITALS — BP 104/66 | HR 61 | Wt 120.0 lb

## 2022-12-07 DIAGNOSIS — N952 Postmenopausal atrophic vaginitis: Secondary | ICD-10-CM | POA: Diagnosis not present

## 2022-12-07 DIAGNOSIS — Z4689 Encounter for fitting and adjustment of other specified devices: Secondary | ICD-10-CM | POA: Diagnosis not present

## 2022-12-07 NOTE — Progress Notes (Signed)
GYNECOLOGY  VISIT   HPI: 62 y.o.   Married White or Caucasian Not Hispanic or Latino  female   [phone] with No LMP recorded. Patient is postmenopausal.   here for pessary maintenance. She has a h/o a grade 2 cystocele and grade 1-2 uterine prolapse. She was fitted with a #5 ring pessary with support at the end of July, 2023. She uses estrogen cream 2 x a week. She takes the pessary out overnight every 1-2 weeks.    GYNECOLOGIC HISTORY: No LMP recorded. Patient is postmenopausal. Contraception:pmp Menopausal hormone therapy: estrace         OB History     Gravida  2   Para  2   Term  2   Preterm      AB      Living  2      SAB      IAB      Ectopic      Multiple      Live Births  2              Patient Active Problem List   Diagnosis Date Noted   Basal cell carcinoma of neck 07/20/2021   Benign neoplasm of skin of lower limb 07/20/2021   History of malignant neoplasm of skin 07/20/2021   Impetigo 07/20/2021   Lentigo 07/20/2021   Midline cystocele 07/01/2020   Vaginal atrophy 07/01/2020   History of osteoporosis 07/01/2020    Past Medical History:  Diagnosis Date   Osteoporosis     Past Surgical History:  Procedure Laterality Date   TONSILECTOMY/ADENOIDECTOMY WITH MYRINGOTOMY Bilateral     Current Outpatient Medications  Medication Sig Dispense Refill   calcium-vitamin D (OSCAL WITH D) 500-200 MG-UNIT tablet Take 1 tablet by mouth.     estradiol (ESTRACE) 0.1 MG/GM vaginal cream 1 gram vaginally twice weekly 42.5 g 1   PROLIA 60 MG/ML SOSY injection Inject into the skin.     sertraline (ZOLOFT) 25 MG tablet Take 25 mg by mouth daily.     No current facility-administered medications for this visit.     ALLERGIES: Patient has no known allergies.  Family History  Problem Relation Age of Onset   Leukemia Mother        CLL turned into lymphoma.    Lung cancer Father        died at 34   Breast cancer Paternal Grandmother    Breast cancer  Other 30       paternal 1/2 Aunt    Social History   Socioeconomic History   Marital status: Married    Spouse name: Not on file   Number of children: Not on file   Years of education: Not on file   Highest education level: Not on file  Occupational History   Not on file  Tobacco Use   Smoking status: Never   Smokeless tobacco: Never  Vaping Use   Vaping Use: Never used  Substance and Sexual Activity   Alcohol use: Yes    Alcohol/week: 2.0 standard drinks of alcohol    Types: 2 Glasses of wine per week   Drug use: Never   Sexual activity: Yes    Birth control/protection: Post-menopausal  Other Topics Concern   Not on file  Social History Narrative   Not on file   Social Determinants of Health   Financial Resource Strain: Not on file  Food Insecurity: Not on file  Transportation Needs: Not on file  Physical Activity:  Not on file  Stress: Not on file  Social Connections: Not on file  Intimate Partner Violence: Not on file    Review of Systems  All other systems reviewed and are negative.   PHYSICAL EXAMINATION:    BP 104/66   Pulse 61   Wt 120 lb (54.4 kg)   SpO2 100%   BMI 21.26 kg/m     General appearance: alert, cooperative and appears stated age   Pelvic: External genitalia:  no lesions              Urethra:  normal appearing urethra with no masses, tenderness or lesions              Bartholins and Skenes: normal                 Vagina: the pessary was removed and cleaned. No vaginal irritation. The pessary was replaced              Cervix: no lesions               Chaperone, Carolynn Serve, CMA was present for exam.  1. Pessary maintenance Doing well F/U in 3 months  2. Vaginal atrophy Continue with 2 x a week vaginal estrogen

## 2023-02-14 DIAGNOSIS — H43813 Vitreous degeneration, bilateral: Secondary | ICD-10-CM | POA: Diagnosis not present

## 2023-03-13 ENCOUNTER — Encounter: Payer: Self-pay | Admitting: Nurse Practitioner

## 2023-03-13 ENCOUNTER — Ambulatory Visit: Payer: Federal, State, Local not specified - PPO | Admitting: Nurse Practitioner

## 2023-03-13 VITALS — BP 104/62 | HR 72 | Wt 121.0 lb

## 2023-03-13 DIAGNOSIS — N8111 Cystocele, midline: Secondary | ICD-10-CM | POA: Diagnosis not present

## 2023-03-13 DIAGNOSIS — N814 Uterovaginal prolapse, unspecified: Secondary | ICD-10-CM

## 2023-03-13 DIAGNOSIS — Z4689 Encounter for fitting and adjustment of other specified devices: Secondary | ICD-10-CM

## 2023-03-13 DIAGNOSIS — N952 Postmenopausal atrophic vaginitis: Secondary | ICD-10-CM | POA: Diagnosis not present

## 2023-03-13 NOTE — Progress Notes (Signed)
Acute Office Visit  Subjective:    Patient ID: Jenny Saunders, female    DOB: [birth year], 62 y.o.   MRN: [id number]   HPI 62 y.o. presents today for pessary maintenance. Pessary #5 ring for management of cystocele and uterine prolapse since July 2023. Using vaginal estrogen twice weekly. Removes pessary overnight every 1-2 weeks. No concerns. Good management.   No LMP recorded. Patient is postmenopausal.    Review of Systems  Constitutional: Negative.   Genitourinary: Negative.        Objective:    Physical Exam Constitutional:      Appearance: Normal appearance.  Genitourinary:    General: Normal vulva.     Vagina: Normal.     Cervix: Normal.     Uterus: With uterine prolapse (grade 1-2).      Comments: Grade 2 cystocele    BP 104/62   Pulse 72   Wt 121 lb (54.9 kg)   SpO2 100%   BMI 21.43 kg/m  Wt Readings from Last 3 Encounters:  03/13/23 121 lb (54.9 kg)  12/07/22 120 lb (54.4 kg)  08/29/22 119 lb (54 kg)        Patient informed chaperone available to be present for breast and/or pelvic exam. Patient has requested no chaperone to be present. Patient has been advised what will be completed during breast and pelvic exam.   Assessment & Plan:   Problem List Items Addressed This Visit       Genitourinary   Midline cystocele   Vaginal atrophy   Other Visit Diagnoses     Pessary maintenance    -  Primary   Uterine prolapse          Plan: Pessary removed, cleaned and reinserted. Patient tolerated well. Normal vaginal exam. Good management. Continue vaginal estrogen twice weekly. Return in 3 months for pessary maintenance.      Olivia Mackie DNP, 9:13 AM 03/13/2023

## 2023-04-04 DIAGNOSIS — Z1231 Encounter for screening mammogram for malignant neoplasm of breast: Secondary | ICD-10-CM | POA: Diagnosis not present

## 2023-04-04 DIAGNOSIS — Z96651 Presence of right artificial knee joint: Secondary | ICD-10-CM | POA: Diagnosis not present

## 2023-04-04 DIAGNOSIS — M8588 Other specified disorders of bone density and structure, other site: Secondary | ICD-10-CM | POA: Diagnosis not present

## 2023-04-04 DIAGNOSIS — Z8262 Family history of osteoporosis: Secondary | ICD-10-CM | POA: Diagnosis not present

## 2023-04-11 DIAGNOSIS — M810 Age-related osteoporosis without current pathological fracture: Secondary | ICD-10-CM | POA: Diagnosis not present

## 2023-04-11 DIAGNOSIS — Z23 Encounter for immunization: Secondary | ICD-10-CM | POA: Diagnosis not present

## 2023-06-12 ENCOUNTER — Ambulatory Visit: Payer: Federal, State, Local not specified - PPO | Admitting: Nurse Practitioner

## 2023-06-12 VITALS — BP 104/62 | HR 58 | Wt 121.0 lb

## 2023-06-12 DIAGNOSIS — N814 Uterovaginal prolapse, unspecified: Secondary | ICD-10-CM

## 2023-06-12 DIAGNOSIS — N8111 Cystocele, midline: Secondary | ICD-10-CM | POA: Diagnosis not present

## 2023-06-12 DIAGNOSIS — Z4689 Encounter for fitting and adjustment of other specified devices: Secondary | ICD-10-CM

## 2023-06-12 DIAGNOSIS — N952 Postmenopausal atrophic vaginitis: Secondary | ICD-10-CM | POA: Diagnosis not present

## 2023-06-12 NOTE — Progress Notes (Signed)
   Acute Office Visit  Subjective:    Patient ID: Jenny Saunders, female    DOB: 05-26-1961, 62 y.o.   MRN: [id number]   HPI 62 y.o. presents today for pessary maintenance. Pessary #5 ring for management of cystocele and uterine prolapse since July 2023. Using vaginal estrogen twice weekly. Removes pessary overnight every 1-2 weeks. No concerns. Good management.   No LMP recorded. Patient is postmenopausal.    Review of Systems  Constitutional: Negative.   Genitourinary: Negative.        Objective:    Physical Exam Constitutional:      Appearance: Normal appearance.  Genitourinary:    General: Normal vulva.     Vagina: Normal.     Cervix: Normal.     Uterus: With uterine prolapse (grade 1-2).      Comments: Grade 2 cystocele    BP 104/62   Pulse (!) 58   Wt 121 lb (54.9 kg)   SpO2 100%   BMI 21.43 kg/m  Wt Readings from Last 3 Encounters:  06/12/23 121 lb (54.9 kg)  03/13/23 121 lb (54.9 kg)  12/07/22 120 lb (54.4 kg)        Patient informed chaperone available to be present for breast and/or pelvic exam. Patient has requested no chaperone to be present. Patient has been advised what will be completed during breast and pelvic exam.   Assessment & Plan:   Problem List Items Addressed This Visit       Genitourinary   Midline cystocele   Vaginal atrophy   Other Visit Diagnoses       Pessary maintenance    -  Primary     Uterine prolapse           Plan: Pessary removed, cleaned and reinserted. Patient tolerated well. Normal vaginal exam. Good management. Continue vaginal estrogen twice weekly. Return in 3 months for annual and pessary maintenance.      Olivia Mackie DNP, 9:55 AM 06/12/2023

## 2023-08-01 DIAGNOSIS — M810 Age-related osteoporosis without current pathological fracture: Secondary | ICD-10-CM | POA: Diagnosis not present

## 2023-08-01 DIAGNOSIS — Z0000 Encounter for general adult medical examination without abnormal findings: Secondary | ICD-10-CM | POA: Diagnosis not present

## 2023-08-01 DIAGNOSIS — Z13220 Encounter for screening for lipoid disorders: Secondary | ICD-10-CM | POA: Diagnosis not present

## 2023-08-07 DIAGNOSIS — Z0000 Encounter for general adult medical examination without abnormal findings: Secondary | ICD-10-CM | POA: Diagnosis not present

## 2023-08-30 ENCOUNTER — Ambulatory Visit: Payer: Federal, State, Local not specified - PPO | Admitting: Nurse Practitioner

## 2023-09-20 DIAGNOSIS — C44712 Basal cell carcinoma of skin of right lower limb, including hip: Secondary | ICD-10-CM | POA: Diagnosis not present

## 2023-09-20 DIAGNOSIS — Z85828 Personal history of other malignant neoplasm of skin: Secondary | ICD-10-CM | POA: Diagnosis not present

## 2023-09-20 DIAGNOSIS — D229 Melanocytic nevi, unspecified: Secondary | ICD-10-CM | POA: Diagnosis not present

## 2023-09-20 DIAGNOSIS — Z86018 Personal history of other benign neoplasm: Secondary | ICD-10-CM | POA: Diagnosis not present

## 2023-09-20 DIAGNOSIS — L821 Other seborrheic keratosis: Secondary | ICD-10-CM | POA: Diagnosis not present

## 2023-09-20 DIAGNOSIS — D485 Neoplasm of uncertain behavior of skin: Secondary | ICD-10-CM | POA: Diagnosis not present

## 2023-10-04 DIAGNOSIS — C44712 Basal cell carcinoma of skin of right lower limb, including hip: Secondary | ICD-10-CM | POA: Diagnosis not present

## 2023-10-16 DIAGNOSIS — M810 Age-related osteoporosis without current pathological fracture: Secondary | ICD-10-CM | POA: Diagnosis not present

## 2023-11-14 ENCOUNTER — Ambulatory Visit: Admitting: Nurse Practitioner

## 2024-01-08 ENCOUNTER — Ambulatory Visit (INDEPENDENT_AMBULATORY_CARE_PROVIDER_SITE_OTHER): Admitting: Nurse Practitioner

## 2024-01-08 ENCOUNTER — Other Ambulatory Visit (HOSPITAL_COMMUNITY)
Admission: RE | Admit: 2024-01-08 | Discharge: 2024-01-08 | Disposition: A | Discharge status: Home / Self Care | Source: Ambulatory Visit | Attending: Nurse Practitioner | Admitting: Nurse Practitioner

## 2024-01-08 ENCOUNTER — Encounter: Payer: Self-pay | Admitting: Nurse Practitioner

## 2024-01-08 VITALS — BP 106/68 | HR 65 | Ht 63.25 in | Wt 121.0 lb

## 2024-01-08 DIAGNOSIS — Z124 Encounter for screening for malignant neoplasm of cervix: Secondary | ICD-10-CM | POA: Diagnosis not present

## 2024-01-08 DIAGNOSIS — N8111 Cystocele, midline: Secondary | ICD-10-CM | POA: Diagnosis not present

## 2024-01-08 DIAGNOSIS — N814 Uterovaginal prolapse, unspecified: Secondary | ICD-10-CM

## 2024-01-08 DIAGNOSIS — Z01419 Encounter for gynecological examination (general) (routine) without abnormal findings: Secondary | ICD-10-CM

## 2024-01-08 DIAGNOSIS — Z1331 Encounter for screening for depression: Secondary | ICD-10-CM

## 2024-01-08 DIAGNOSIS — N952 Postmenopausal atrophic vaginitis: Secondary | ICD-10-CM | POA: Diagnosis not present

## 2024-01-08 DIAGNOSIS — M810 Age-related osteoporosis without current pathological fracture: Secondary | ICD-10-CM

## 2024-01-08 DIAGNOSIS — Z4689 Encounter for fitting and adjustment of other specified devices: Secondary | ICD-10-CM

## 2024-01-08 DIAGNOSIS — Z1151 Encounter for screening for human papillomavirus (HPV): Secondary | ICD-10-CM | POA: Insufficient documentation

## 2024-01-08 DIAGNOSIS — Z780 Asymptomatic menopausal state: Secondary | ICD-10-CM | POA: Diagnosis not present

## 2024-01-08 MED ORDER — ESTRADIOL 0.1 MG/GM VA CREA: TOPICAL_CREAM | VAGINAL | 2 refills | Status: DC

## 2024-01-08 NOTE — Progress Notes (Signed)
 Jenny Saunders 1961-05-27 [id number]   History:  63 y.o. G2P2002 presents for annual exam and pessary maintenance. Postmenopausal - no systemic HRT. H/O grade 2 cystocele and grade 1-2 uterine prolapse. Using #5 ring pessary with support. She uses estrogen cream twice a week. She takes the pessary out overnight every 1-2 weeks. No vaginal bleeding. No dyspareunia. No bowel or bladder complaints. She hasn't had issues with incontinence since using the pessary. Normal pap history. Husband had recent brain bleed, doing well, some vision changes.   Mammogram from 2021 calculated her TC risk of breast cancer at 22.9%. But last few mammogram have not mentioned it. She was previously offered breast MRI and didn't do it.   Prolia  for osteoporosis, managed by PCP.   Gynecologic History No LMP recorded. Patient is postmenopausal.   Contraception/Family planning: post menopausal status Sexually active: Yes  Health Maintenance Last Pap: 06/25/2019. Results were: Normal neg HPV Last mammogram: 04/04/2023. Results were: Normal per patient Last colonoscopy: 2021. Results were: Normal per patient, 5-year recall Last Dexa: 2024. Results were: osteoporosis, stable     01/08/2024   10:11 AM  Depression screen PHQ 2/9  Decreased Interest 0  Down, Depressed, Hopeless 0  PHQ - 2 Score 0     Past medical history, past surgical history, family history and social history were all reviewed and documented in the EPIC chart. 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 Husband is an Pensions consultant. 2 sons, one in [place] , one in [place].   ROS:  A ROS was performed and pertinent positives and negatives are included.  Exam:  Vitals:   01/08/24 1007  BP: 106/68  Pulse: 65  SpO2: 98%  Weight: 121 lb (54.9 kg)  Height: 5' 3.25 (1.607 m)   Body mass index is 21.27 kg/m.  General appearance:  Normal Thyroid:  Symmetrical, normal in size, without palpable masses or nodularity. Respiratory  Auscultation:  Clear without  wheezing or rhonchi Cardiovascular  Auscultation:  Regular rate, without rubs, murmurs or gallops  Edema/varicosities:  Not grossly evident Abdominal  Soft,nontender, without masses, guarding or rebound.  Liver/spleen:  No organomegaly noted  Hernia:  None appreciated  Skin  Inspection:  Grossly normal Breasts: Examined lying and sitting.   Right: Without masses, retractions, nipple discharge or axillary adenopathy.   Left: Without masses, retractions, nipple discharge or axillary adenopathy. Pelvic: External genitalia:  no lesions              Urethra:  normal appearing urethra with no masses, tenderness or lesions              Bartholins and Skenes: normal                 Vagina: normal appearing vagina with normal color and discharge, no lesions. Uterine prolapse grade 1-2, grade 2 cystocele              Cervix: no lesions Bimanual Exam:  Uterus:  no masses or tenderness              Adnexa: no mass, fullness, tenderness              Rectovaginal: Deferred              Anus:  normal, no lesions  Dereck Keas, CMA present as chaperone.   Assessment/Plan:  63 y.o. [id number] for annual exam.   Well female exam with routine gynecological exam - Plan: Cytology - PAP( [place]). Education provided on SBEs, importance of preventative screenings,  current guidelines, high calcium diet, regular exercise, and multivitamin daily. Labs with PCP.   Postmenopausal - No systemic HRT, no bleeding  Cervical cancer screening - Plan: Cytology - PAP( [place]). Normal pap history.   Pessary maintenance - #5 ring with support. Good management. Removes overnight every 1-2 weeks. Pessary removed, cleaned and reinserted with ease.   Vaginal atrophy - Plan: estradiol  (ESTRACE ) 0.1 MG/GM vaginal cream twice weekly.   Uterine prolapse - grade 1-2. Good management with pessary.   63-year-related osteoporosis without current pathological fracture - Managed by PCP. Prolia .   Midline cystocele - Good  management with pessary. No urinary symptoms since using pessary.   Screening for breast cancer - Normal mammogram history.  Continue annual screenings.  Normal breast exam today.  Screening for colon cancer - 2021 colonoscopy. Will repeat at 5-year interval per GI's recommendation.   Return in about 3 months (around 04/09/2024) for Pessary maintenance, 1 year annual exam. .   Jenny DELENA Shutter DNP, 10:44 AM 01/08/2024

## 2024-01-10 ENCOUNTER — Ambulatory Visit: Payer: Self-pay | Admitting: Nurse Practitioner

## 2024-01-10 LAB — CYTOLOGY - PAP
Comment: NEGATIVE
Diagnosis: NEGATIVE
High risk HPV: NEGATIVE

## 2024-03-05 DIAGNOSIS — H43813 Vitreous degeneration, bilateral: Secondary | ICD-10-CM | POA: Diagnosis not present

## 2024-04-07 DIAGNOSIS — Z1231 Encounter for screening mammogram for malignant neoplasm of breast: Secondary | ICD-10-CM | POA: Diagnosis not present

## 2024-04-08 ENCOUNTER — Ambulatory Visit: Admitting: Nurse Practitioner

## 2024-04-08 ENCOUNTER — Encounter: Payer: Self-pay | Admitting: Nurse Practitioner

## 2024-04-08 VITALS — BP 98/62 | HR 60

## 2024-04-08 DIAGNOSIS — Z4689 Encounter for fitting and adjustment of other specified devices: Secondary | ICD-10-CM | POA: Diagnosis not present

## 2024-04-08 DIAGNOSIS — N952 Postmenopausal atrophic vaginitis: Secondary | ICD-10-CM

## 2024-04-08 DIAGNOSIS — N814 Uterovaginal prolapse, unspecified: Secondary | ICD-10-CM

## 2024-04-08 DIAGNOSIS — N8111 Cystocele, midline: Secondary | ICD-10-CM

## 2024-04-08 NOTE — Progress Notes (Signed)
   Acute Office Visit  Subjective:    Patient ID: Jenny Saunders, female    DOB: [birth year], 63 y.o.   MRN: [id number]   HPI 63 y.o. presents today for pessary maintenance. Pessary #5 ring for management of cystocele and uterine prolapse since July 2023. Using vaginal estrogen twice weekly. Removes pessary overnight every 1-2 weeks. No concerns. Good management.   No LMP recorded. Patient is postmenopausal.    Review of Systems  Constitutional: Negative.   Genitourinary: Negative.        Objective:    Physical Exam Constitutional:      Appearance: Normal appearance.  Genitourinary:    General: Normal vulva.     Vagina: Normal.     Cervix: Normal.     Uterus: With uterine prolapse (grade 1-2).      Comments: Grade 2 cystocele    BP 98/62   Pulse 60   SpO2 98%  Wt Readings from Last 3 Encounters:  01/08/24 121 lb (54.9 kg)  06/12/23 121 lb (54.9 kg)  03/13/23 121 lb (54.9 kg)        Zada Louder, CMA present as Biomedical engineer.   Assessment & Plan:   Problem List Items Addressed This Visit       Genitourinary   Midline cystocele   Vaginal atrophy   Other Visit Diagnoses       Pessary maintenance    -  Primary     Uterine prolapse          Plan: Pessary removed, cleaned and reinserted. Patient tolerated well. Normal vaginal exam. Good management. Continue vaginal estrogen twice weekly.   Return in about 3 months (around 07/09/2024) for Pessary maintenance.      Annabella DELENA Shutter DNP, 8:50 AM 04/08/2024

## 2024-04-21 DIAGNOSIS — M810 Age-related osteoporosis without current pathological fracture: Secondary | ICD-10-CM | POA: Diagnosis not present

## 2024-07-08 ENCOUNTER — Ambulatory Visit (INDEPENDENT_AMBULATORY_CARE_PROVIDER_SITE_OTHER): Admitting: Nurse Practitioner

## 2024-07-08 ENCOUNTER — Encounter: Payer: Self-pay | Admitting: Nurse Practitioner

## 2024-07-08 VITALS — BP 100/60 | HR 70 | Resp 16

## 2024-07-08 DIAGNOSIS — N8111 Cystocele, midline: Secondary | ICD-10-CM | POA: Diagnosis not present

## 2024-07-08 DIAGNOSIS — N952 Postmenopausal atrophic vaginitis: Secondary | ICD-10-CM

## 2024-07-08 DIAGNOSIS — Z4689 Encounter for fitting and adjustment of other specified devices: Secondary | ICD-10-CM

## 2024-07-08 DIAGNOSIS — N814 Uterovaginal prolapse, unspecified: Secondary | ICD-10-CM

## 2024-07-08 MED ORDER — ESTRADIOL 0.01 % VA CREA: 1.0000 g | TOPICAL_CREAM | VAGINAL | 1 refills | Status: AC

## 2024-07-08 NOTE — Progress Notes (Signed)
" ° °  Acute Office Visit  Subjective:    Patient ID: Jenny Saunders, female    DOB: Dec 08, 1960, 64 y.o.   MRN: [id number]   HPI 64 y.o. presents today for pessary maintenance. Pessary #5 ring for management of cystocele and uterine prolapse since July 2023. Using vaginal estrogen twice weekly. Removes pessary overnight every 1-2 weeks. No concerns. Good management.   No LMP recorded. Patient is postmenopausal.    Review of Systems  Constitutional: Negative.   Genitourinary: Negative.        Objective:    Physical Exam Constitutional:      Appearance: Normal appearance.  Genitourinary:    General: Normal vulva.     Vagina: Normal.     Cervix: Normal.     Uterus: With uterine prolapse (grade 1-2).      Comments: Grade 2 cystocele    BP 100/60   Pulse 70   Resp 16  Wt Readings from Last 3 Encounters:  01/08/24 121 lb (54.9 kg)  06/12/23 121 lb (54.9 kg)  03/13/23 121 lb (54.9 kg)        Jenny Saunders, CMA present as biomedical engineer.   Assessment & Plan:   Problem List Items Addressed This Visit       Genitourinary   Midline cystocele   Vaginal atrophy   Relevant Medications   estradiol  (ESTRACE ) 0.01 % CREA vaginal cream (Start on 07/10/2024)   Other Visit Diagnoses       Pessary maintenance    -  Primary     Uterine prolapse           Plan: Pessary removed, cleaned and reinserted. Patient tolerated well. Normal vaginal exam. Good management. Self-manages. Continue vaginal estrogen twice weekly.  Return if symptoms worsen or fail to improve.      Jenny DELENA Shutter DNP, 9:52 AM 07/08/2024  "

## 2025-01-08 ENCOUNTER — Ambulatory Visit: Admitting: Nurse Practitioner

## 2025-01-14 ENCOUNTER — Ambulatory Visit: Admitting: Nurse Practitioner
